# Patient Record
Sex: Female | Born: 1990
Health system: Southern US, Community
[De-identification: ages and names within clinical notes are randomized; demographics above are authoritative.]

## PROBLEM LIST (undated history)

## (undated) DIAGNOSIS — F419 Anxiety disorder, unspecified: Secondary | ICD-10-CM

## (undated) DIAGNOSIS — I10 Essential (primary) hypertension: Secondary | ICD-10-CM

## (undated) DIAGNOSIS — F32A Depression, unspecified: Secondary | ICD-10-CM

## (undated) HISTORY — DX: Anxiety disorder, unspecified: F41.9

## (undated) HISTORY — DX: Essential (primary) hypertension: I10

## (undated) HISTORY — DX: Depression, unspecified: F32.A

---
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
## 2017-01-08 LAB — CYTOLOGY - PAP: Pap: NEGATIVE

## 2019-01-06 ENCOUNTER — Encounter: Payer: Self-pay | Admitting: *Deleted

## 2019-01-06 ENCOUNTER — Ambulatory Visit (INDEPENDENT_AMBULATORY_CARE_PROVIDER_SITE_OTHER): Payer: Self-pay | Admitting: *Deleted

## 2019-01-06 ENCOUNTER — Other Ambulatory Visit: Payer: Self-pay

## 2019-01-06 DIAGNOSIS — Z8619 Personal history of other infectious and parasitic diseases: Secondary | ICD-10-CM | POA: Insufficient documentation

## 2019-01-06 DIAGNOSIS — Z3490 Encounter for supervision of normal pregnancy, unspecified, unspecified trimester: Secondary | ICD-10-CM

## 2019-01-06 NOTE — Progress Notes (Signed)
I connected with  Sally Crosby on 01/06/19 at  9:30 AM EDT by telephone and verified that I am speaking with the correct person using two identifiers.   I discussed the limitations, risks, security and privacy concerns of performing an evaluation and management service by telephone and the availability of in person appointments. I also discussed with the patient that there may be a patient responsible charge related to this service. The patient expressed understanding and agreed to proceed.  Explained I am completing her New OB Intake today. We discussed Her EDD and that it is based on  sure LMP . I reviewed her allergies, meds, OB History, Medical /Surgical history, and appropriate screenings. I explained we will send her Babyscripts app- app sent to her while on phone. She was not able to download while on phone.  She verified she has a blood pressure cuff. I asked her to bring the blood pressure cuff with her to her first ob appointment so we can show her how to use it. Explained  then we will have her take her blood pressure weekly and enter into the app. Explained she will have some visits in office and some virtually. I sent her a MyChart text and she signed up and downloaded the MyChart app.I reviewed her appointment date/ time with her , our location and to wear mask, no visitors. Explained she will have exam, ob bloodwork, hemoglobin a1C, cbg , genetic testing if desired, pap if needed.She states had pap last year- will sign release for report at new ob visit.  I  scheduled an [place] at 19 weeks and gave her the appointment. She voices understanding.  Nneka Blanda,RN 01/06/2019  9:32 AM

## 2019-01-20 ENCOUNTER — Other Ambulatory Visit: Payer: Self-pay

## 2019-01-20 ENCOUNTER — Ambulatory Visit (INDEPENDENT_AMBULATORY_CARE_PROVIDER_SITE_OTHER): Payer: Medicaid Other | Admitting: Obstetrics and Gynecology

## 2019-01-20 VITALS — BP 127/79 | HR 82 | Wt 142.7 lb

## 2019-01-20 DIAGNOSIS — Z3A16 16 weeks gestation of pregnancy: Secondary | ICD-10-CM

## 2019-01-20 DIAGNOSIS — Z3492 Encounter for supervision of normal pregnancy, unspecified, second trimester: Secondary | ICD-10-CM

## 2019-01-20 DIAGNOSIS — Z3490 Encounter for supervision of normal pregnancy, unspecified, unspecified trimester: Secondary | ICD-10-CM

## 2019-01-20 DIAGNOSIS — Z8619 Personal history of other infectious and parasitic diseases: Secondary | ICD-10-CM

## 2019-01-20 MED ORDER — BLOOD PRESSURE KIT DEVI: 1.0000 | Freq: Every day | 0 refills | Status: DC

## 2019-01-20 NOTE — Progress Notes (Signed)
Pap 2018 Normal

## 2019-01-20 NOTE — Progress Notes (Signed)
History:   Sally Crosby is a 28 y.o. G1P0 at 72w2dby LMP being seen today for her first obstetrical visit.  Her obstetrical history is significant for HSV- not on medication. Patient does intend to breast feed. Pregnancy history fully reviewed. Unplanned pregnancy, very excited.  Transfer from [employer] Patient reports no complaints.    HISTORY: OB History  Gravida Para Term Preterm AB Living  1 0 0 0 0 0  SAB TAB Ectopic Multiple Live Births  0 0 0 0 0    # Outcome Date GA Lbr Len/2nd Weight Sex Delivery Anes PTL Lv  1 Current             Last pap smear was done 2018 and was normal  No past medical history on file. Past Surgical History:  Procedure Laterality Date  . HERNIA REPAIR  1999   Family History  Problem Relation Age of Onset  . Hypertension Mother    Social History   Tobacco Use  . Smoking status: Never Smoker  . Smokeless tobacco: Never Used  Substance Use Topics  . Alcohol use: Not Currently    Comment: occasional  . Drug use: Never   No Known Allergies Current Outpatient Medications on File Prior to Visit  Medication Sig Dispense Refill  . Prenatal Vit-Fe Fumarate-FA (PRENATAL VITAMIN PO) Take 1 tablet by mouth daily.    .Marland Kitchentriamcinolone cream (KENALOG) 0.1 % APPLY TO AFFECTED AREA TWICE A DAY    . valACYclovir (VALTREX) 1000 MG tablet Take 1,000 mg by mouth daily.     No current facility-administered medications on file prior to visit.     Review of Systems Pertinent items noted in HPI and remainder of comprehensive ROS otherwise negative. Physical Exam:   Vitals:   01/20/19 1026  BP: 127/79  Pulse: 82  Weight: 142 lb 11.2 oz (64.7 kg)   Fetal Heart Rate (bpm): 150 Uterus:     System: General: well-developed, well-nourished female in no acute distress   Skin: normal coloration and turgor, no rashes   Neurologic: oriented, normal, negative, normal mood   Extremities: normal strength, tone, and muscle mass, ROM of all joints is  normal   HEENT PERRLA, extraocular movement intact and sclera clear, anicteric   Mouth/Teeth mucous membranes moist, pharynx normal without lesions and dental hygiene good   Neck supple and no masses   Cardiovascular: regular rate and rhythm   Respiratory:  no respiratory distress, normal breath sounds   Abdomen: soft, non-tender; bowel sounds normal; no masses,  no organomegaly    Assessment:    Pregnancy: G1P0 Patient Active Problem List   Diagnosis Date Noted  . Supervision of low-risk pregnancy 01/06/2019  . History of herpes genitalis 01/06/2019     Plan:    1. Encounter for supervision of low-risk pregnancy, antepartum  - Hemoglobin A1c - Obstetric Panel, Including HIV - Culture, OB Urine - Genetic Screening - Blood Pressure Monitoring (BLOOD PRESSURE KIT) DEVI; 1 Device by Does not apply route daily. ICD 10: Z34.00  Dispense: 1 Device; Refill: 0 - AFP, Serum, Open Spina Bifida Not do for pap, fully dressed today. Will do breast exam at next appt.   2. History of herpes genitalis  Suppression @ 36 weeks, discussed this    Initial labs drawn. Continue prenatal vitamins. Genetic Screening discussed, NIPS: requested. Ultrasound discussed; fetal anatomic survey: ordered. Problem list reviewed and updated. The nature of CLatonwith multiple MDs  and other Advanced Practice Providers was explained to patient; also emphasized that residents, students are part of our team. Routine obstetric precautions reviewed. Return in about 4 weeks (around 02/17/2019), or For virual visit in 4 weeks.      Marek Nghiem, Artist Pais, [place] for [employer], [place]

## 2019-01-22 LAB — OBSTETRIC PANEL, INCLUDING HIV
Antibody Screen: NEGATIVE
Basophils Absolute: 0 10*3/uL (ref 0.0–0.2)
Basos: 0 %
EOS (ABSOLUTE): 0.1 10*3/uL (ref 0.0–0.4)
Eos: 1 %
HIV Screen 4th Generation wRfx: NONREACTIVE
Hematocrit: 35.7 % (ref 34.0–46.6)
Hemoglobin: 12 g/dL (ref 11.1–15.9)
Hepatitis B Surface Ag: NEGATIVE
Immature Grans (Abs): 0.1 10*3/uL (ref 0.0–0.1)
Immature Granulocytes: 1 %
Lymphocytes Absolute: 2.4 10*3/uL (ref 0.7–3.1)
Lymphs: 25 %
MCH: 28.7 pg (ref 26.6–33.0)
MCHC: 33.6 g/dL (ref 31.5–35.7)
MCV: 85 fL (ref 79–97)
Monocytes Absolute: 0.9 10*3/uL (ref 0.1–0.9)
Monocytes: 9 %
Neutrophils Absolute: 6 10*3/uL (ref 1.4–7.0)
Neutrophils: 64 %
Platelets: 277 10*3/uL (ref 150–450)
RBC: 4.18 x10E6/uL (ref 3.77–5.28)
RDW: 13.3 % (ref 11.7–15.4)
RPR Ser Ql: NONREACTIVE
Rh Factor: POSITIVE
Rubella Antibodies, IGG: 2.71 index (ref >0.99)
WBC: 9.4 10*3/uL (ref 3.4–10.8)

## 2019-01-22 LAB — AFP, SERUM, OPEN SPINA BIFIDA
AFP MoM: 1.19
AFP Value: 45.1 ng/mL
Gest. Age on Collection Date: 16 weeks
Maternal Age At EDD: 29.4 yr
OSBR Risk 1 IN: 10000
Test Results:: NEGATIVE
Weight: 142 [lb_av]

## 2019-01-22 LAB — HEMOGLOBIN A1C
Est. average glucose Bld gHb Est-mCnc: 114 mg/dL
Hgb A1c MFr Bld: 5.6 % (ref 4.8–5.6)

## 2019-01-23 LAB — CULTURE, OB URINE

## 2019-01-23 LAB — URINE CULTURE, OB REFLEX

## 2019-02-03 ENCOUNTER — Encounter: Payer: Self-pay | Admitting: *Deleted

## 2019-02-08 ENCOUNTER — Ambulatory Visit (HOSPITAL_COMMUNITY)
Admission: RE | Admit: 2019-02-08 | Discharge: 2019-02-08 | Disposition: A | Discharge status: Home / Self Care | Payer: Medicaid Other | Source: Ambulatory Visit | Attending: Obstetrics and Gynecology | Admitting: Obstetrics and Gynecology

## 2019-02-08 ENCOUNTER — Other Ambulatory Visit (HOSPITAL_COMMUNITY): Payer: Self-pay | Admitting: *Deleted

## 2019-02-08 ENCOUNTER — Other Ambulatory Visit: Payer: Self-pay

## 2019-02-08 DIAGNOSIS — Z362 Encounter for other antenatal screening follow-up: Secondary | ICD-10-CM

## 2019-02-08 DIAGNOSIS — Z363 Encounter for antenatal screening for malformations: Secondary | ICD-10-CM

## 2019-02-08 DIAGNOSIS — Z3490 Encounter for supervision of normal pregnancy, unspecified, unspecified trimester: Secondary | ICD-10-CM | POA: Diagnosis present

## 2019-02-08 DIAGNOSIS — Z3A19 19 weeks gestation of pregnancy: Secondary | ICD-10-CM

## 2019-02-10 ENCOUNTER — Telehealth (INDEPENDENT_AMBULATORY_CARE_PROVIDER_SITE_OTHER): Payer: Medicaid Other | Admitting: Lactation Services

## 2019-02-10 DIAGNOSIS — B373 Candidiasis of vulva and vagina: Secondary | ICD-10-CM

## 2019-02-10 DIAGNOSIS — B3731 Acute candidiasis of vulva and vagina: Secondary | ICD-10-CM

## 2019-02-10 MED ORDER — TERCONAZOLE 0.4 % VA CREA: 1.0000 | TOPICAL_CREAM | Freq: Every day | VAGINAL | 0 refills | Status: DC

## 2019-02-10 NOTE — Telephone Encounter (Signed)
Pt called and reports she feels like she has a yeast infection. She reports she is dry and has a white discharge and vagina feels irritated. She has had yeast infections in the past.   Terconazole prescription sent to Pharmacy.   Informed to call the office if she does not improve post treatment. Pt voiced understanding.

## 2019-02-15 ENCOUNTER — Telehealth (INDEPENDENT_AMBULATORY_CARE_PROVIDER_SITE_OTHER): Payer: Medicaid Other | Admitting: Lactation Services

## 2019-02-15 DIAGNOSIS — Z3492 Encounter for supervision of normal pregnancy, unspecified, second trimester: Secondary | ICD-10-CM

## 2019-02-15 NOTE — Telephone Encounter (Signed)
Called pt to inform her that [facility] results were positive for Silent Carrier for Alpha Thalassemia. Informed pt it is recommended that she call [facility] at [phone] to discuss results and to determine if further testing is recommended. Pt voiced understanding.

## 2019-02-16 ENCOUNTER — Telehealth: Payer: Self-pay | Admitting: Obstetrics and Gynecology

## 2019-02-16 NOTE — Telephone Encounter (Signed)
Spoke with patient about her appointment on 11/19 @ 9:35. Patient instructed that the appointment is a mychart visit. Patient instructed to download the mychart app if not already done so. Patient verbalized she has the app downloaded. Patient instructed that a nurse will be calling her around her appointment time.

## 2019-02-17 ENCOUNTER — Telehealth (INDEPENDENT_AMBULATORY_CARE_PROVIDER_SITE_OTHER): Payer: Medicaid Other | Admitting: Obstetrics and Gynecology

## 2019-02-17 ENCOUNTER — Other Ambulatory Visit: Payer: Self-pay

## 2019-02-17 DIAGNOSIS — D219 Benign neoplasm of connective and other soft tissue, unspecified: Secondary | ICD-10-CM

## 2019-02-17 DIAGNOSIS — Z3A20 20 weeks gestation of pregnancy: Secondary | ICD-10-CM | POA: Diagnosis not present

## 2019-02-17 DIAGNOSIS — O3412 Maternal care for benign tumor of corpus uteri, second trimester: Secondary | ICD-10-CM | POA: Diagnosis not present

## 2019-02-17 DIAGNOSIS — D563 Thalassemia minor: Secondary | ICD-10-CM

## 2019-02-17 DIAGNOSIS — Z8619 Personal history of other infectious and parasitic diseases: Secondary | ICD-10-CM

## 2019-02-17 DIAGNOSIS — Z3492 Encounter for supervision of normal pregnancy, unspecified, second trimester: Secondary | ICD-10-CM

## 2019-02-17 DIAGNOSIS — D259 Leiomyoma of uterus, unspecified: Secondary | ICD-10-CM

## 2019-02-17 HISTORY — DX: Benign neoplasm of connective and other soft tissue, unspecified: D21.9

## 2019-02-17 HISTORY — DX: Thalassemia minor: D56.3

## 2019-02-17 NOTE — Progress Notes (Signed)
I connected with  Sally Crosby on 02/17/19 at  9:35 AM EST by telephone and verified that I am speaking with the correct person using two identifiers.   I discussed the limitations, risks, security and privacy concerns of performing an evaluation and management service by telephone and the availability of in person appointments. I also discussed with the patient that there may be a patient responsible charge related to this service. The patient expressed understanding and agreed to proceed.  Sally Crosby, [place] 02/17/2019  9:38 AM

## 2019-02-17 NOTE — Patient Instructions (Signed)
AREA PEDIATRIC/FAMILY PRACTICE PHYSICIANS  [place] ([address]) . [facility] o Chambliss, MD; Eniola, MD; Hale, MD; Hensel, MD; McDiarmid, MD; McIntyer, MD; Neal, MD; Walden, MD o [address] [place], [place] [address] o [phone] o Mon-Fri 8:30-12:30, 1:30-5:00 o Providers come to see babies at [facility] o Accepting Medicaid . [facility] o Limited providers who accept newborns: Koirala, MD; Morrow, MD; Wolters, MD o [address], [place], [place] [address] o [phone] o Mon-Fri 8:00-5:30 o Babies seen by providers at [facility] o Does NOT accept Medicaid o Please call early in hospitalization for appointment (limited availability)  . [facility] o Mulberry, MD o [address] [place], [place] [address] o [phone] o Mon, Tue, Thur, Fri 8:30-5:00, Wed 10:00-7:00 (closed 1-2pm) o Babies seen by [facility] providers o Accepting Medicaid . [facility] o Rubin, MD o [address], [place], [place] [address] o [phone] o Mon-Fri 8:30-5:00, Sat 8:30-12:00 o Provider comes to see babies at [facility] o Accepting Medicaid o Must have been referred from current patients or contacted office prior to delivery . [facility] ([facility]) o Brown, MD; [place], MD; Ettefagh, MD; Grant, MD; Lester, MD; McCormick, MD; McQueen, MD; Prose, MD; Simha, MD; Stanley, MD; Stryffeler, NP; Tebben, NP o [address], [place], [place] [address] o [phone] o Mon, Tue, Thur, Fri 8:30-5:30, Wed 9:30-5:30, Sat 8:30-12:30 o Babies seen by [facility] providers o Accepting Medicaid o Only accepting infants of first-time parents or siblings of current patients o Hospital discharge coordinator will make follow-up appointment . Jack Amos o [address],  [place], [place]  [address] o [phone]   Fax - [phone] . [facility] o [address], [place], [place]  [address] o Phone - [phone]   Fax - [phone] . Shilpa Gosrani o [address], [place], [place]  [address] o [phone]  [place] ([address]) . [facility] o Bates, MD; [place], MD; Cooper, Cox, MD; MD; Davis, MD; Dovico, MD; Ettefaugh, MD; Little, MD; Lowe, MD; Keiffer, MD; Melvin, MD; Sumner, MD; Williams, MD o [address], [place], [place] [address] o [phone] o Mon-Fri 8:30-5:00 (extended evenings Mon-Thur as needed), Sat-Sun 10:00-1:00 o Providers come to see babies at [facility] o Accepting Medicaid for families of first-time babies and families with all children in the household age 3 and under. Must register with office prior to making appointment (M-F only). . [facility] o Henson, NP; Knapp, MD; Lalonde, MD; Tysinger, PA o [address] [place], [place] [address] o [phone] o Mon-Fri 8:00-5:00 o Babies seen by providers at [facility] o Does NOT accept Medicaid/Commercial Insurance Only . [facility] ([facility]  o Artis, MD; Barnes, MD; Bratton, MD; Coccaro, MD; Lockett Gardner, MD; Kramer, MD; Marshall, MD; Netherton, MD; Poleto, MD; Skinner, MD o [address] [place], [place] [address] o [phone] o Mon-Fri 8:30-5:30, Sat (Oct.-Mar.) 9:00-1:00 o Babies seen by providers at [facility] o Accepting Medicaid  [place] ([address]) . [facility] o Reid, MD; Warner, MD o [address], [place], [place] [address] o [phone] o Mon-Fri 8:30-5:00, Sat 8:30-12:00 o Providers come to see babies at [facility] o Does NOT accept Medicaid . [facility] o Becker, PA; Hagler, MD; Scifres, PA; Sun, MD; Swayne, MD o [address],  [place], [place] [address] o [phone] o Mon-Fri 8:00-5:00 o Babies seen by providers at [facility] o Does NOT accept Medicaid o Only accepting babies of parents who are patients o Please call early in hospitalization for appointment (limited availability) . [facility] o Clark, MD; Frye, MD; Kelleher, MD; Mack, NP; Miller, MD; O'Keller, MD; Patterson, NP; Pudlo, MD; Puzio, MD; Thomas, MD; Tucker, MD; Twiselton, MD o [address], [place], [place] [address] o [phone] o Mon-Fri 8:00-5:00, Sat 9:00-12:00 o Providers come to see babies at [facility] o Does NOT accept Medicaid  [place] ([address]) . [facility] o Limited providers accepting new patients: Brake, NP; Wharton, PA o [address], [place], [place] [address] o [phone] o Mon-Fri 8:00-5:00 o Babies seen by providers at [facility] o Does NOT accept Medicaid o Only accepting babies of parents who are patients o Please call early in hospitalization for appointment (limited availability) . [facility] o Gay, MD; Quinlan, MD o [address] [place], [place] [address] o [phone] (press 1 to schedule appointment) o Mon-Fri 8:00-5:00 o Providers come to see babies at [facility] o Does NOT accept Medicaid . [facility] o Mazer, MD o [address] [place], [place] [address] o [phone] o Mon-Fri 8:30-5:00 (lunch 12:30-1:00), extended hours by appointment only Wed 5:00-6:30 o Babies seen by [facility] providers o Accepting Medicaid . [facility] o Banks, MD; Jordan, MD; Koberlein, MD o [address], [place], [place] [address] o [phone] o Mon-Fri 8:00-5:00 o Babies seen by [facility] providers o Does NOT accept Medicaid . [facility] o Parker, MD; Hunter, MD; Wallace, DO o [address] [place], [place]  [address] o [phone] o Mon-Fri 8:00-5:00 o Babies seen by [facility] providers o Does NOT accept Medicaid . [facility] o Brandon, PA; Brecken, PA; Christy, NP; Dees, MD; DeClaire, MD; DeWeese, MD; Hansen, NP; Mills, NP; Parrish, NP; Smoot, NP; Summer, MD; Vapne, MD o [address] [place], [place] [address] o [phone] o Mon-Fri 8:30-5:00, Sat 10:00-1:00 o Providers come to see babies at [facility] o Does NOT accept Medicaid o Free prenatal information session Tuesdays at 4:45pm . [facility] o Bouska, MD; Gordon, PA; Jeffery, PA; Weber, PA o [address] [place] [address] o [phone] o Mon-Fri 7:30-5:30 o Babies seen by [facility] providers . [facility] o [address], [place], [place]  [address] o [phone]   Fax - [phone]  [place] ([address] & [address]) . [facility] o Reese, MD o [address] [place], [place] [address] o [phone] o Mon-Thur 8:00-6:00 o Providers come to see babies at [facility] o Accepting Medicaid . [facility] o Anderson, NP; Badger, MD; Beal, PA; Spencer, PA o [address] [place], [place] [address] o [phone] o Mon-Thur 7:30-7:30, Fri 7:30-4:30 o Babies seen by [facility] providers o Accepting Medicaid . [facility] o Agbuya, MD; Klett, NP; Romgoolam, MD o [address], [place],  [address] o [phone] o Mon-Fri 8:30-5:00, Sat 8:30-12:00 o Providers come to see babies at [facility] o Accepting Medicaid o Must have "Meet & Greet" appointment at office prior to delivery . [facility]  [facility] ) o McCord,   MD; Wallace, MD; Wood, MD o [address], [place], [place] [address] o [phone] o Mon-Wed 8:00-6:00, Thur-Fri 8:00-5:00, Sat 9:00-12:00 o Providers come to  see babies at [facility] o Does NOT accept Medicaid o Only accepting siblings of current patients . [facility]  o [address], [place], [place]  [address] o [phone]   Fax - [phone] . [facility] o [address], [place], [place]  [address] o [phone]   Fax - [phone]  [place] ([address] & [address]) . [facility] o Cirigliano, DO; Matthews, DO o [address] [place], [place] [address] o [phone] o Mon-Fri 7:00-5:00 o Babies seen by [facility] providers o Does NOT accept Medicaid . [facility] o Briscoe, MD; Howley, PA; Moreira, PA o [address], [place], [place] [address] o [phone] o Mon-Fri 8:00-5:00 o Babies seen by [facility] providers o Accepting Medicaid . [facility] o Boyd, MD; Church, PA; Jones, NP; Osborn, PA o [address], , [place] [address] o [phone] o Mon-Fri 8:00-5:00 o Babies seen by providers at [facility] o Accepting Medicaid  [place] ([address]) . [facility] [place] o Wendling, DO o [address] [place], [place] [address] o [phone] o Mon-Fri 8:00-5:00 o Babies seen by [facility] providers o Does NOT accept Medicaid o Limited availability, please call early in hospitalization to schedule follow-up . [facility] o Calderon, PA; Cummings, MD; Dillard, MD; Martin, PA; Olson, MD; VanDeven, PA o [address], [place], [place] [address] o [phone] o Mon-Fri 8:30-5:00, Sat 9:00-12:00 o Babies seen by providers at [facility] o Accepting Medicaid o Please register online then schedule online or call office o [email or web address] . [facility] o Hunter, NP; Kumar, MD; Martin Rogers, PA o [address], [place], [place] [address] o [phone] o Mon-Fri 8:00-5:00 o Babies seen by providers at [facility] o Accepting Medicaid . [facility] ([facility] o Titus, MD; Kristi Fleenor, NP; West, MD o [address], [place], [place] [address] o [phone] o Mon-Fri 8:00-5:30, Sat&Sun by appointment (phones open at 8:30) o Babies seen by [facility] providers o Accepting Medicaid o Must be a first-time baby or sibling of current patient . [facility] [place]  o [address], [place], [place]  [address] o [phone]   Fax - [phone]  [place] ([address]) . [facility] o Brown, PA; Cowen, PA; Rice, MD; Helton, PA; Spry, MD o [address] [place], [place] [address] o [phone] o Mon-Thur 8:00-7:00, Fri 8:00-5:00, Sat 8:00-12:00, Sun 9:00-12:00 o Babies seen by [facility] providers o Accepting Medicaid . [facility] o Coe-Goins, MD; Marshall, MD; Pierre-Louis, MD o [address] [place] [address], [place],  [address] o [phone] o Mon-Thur 8:00-5:00 o Babies seen by providers at [facility] o Accepting Medicaid . [facility] o Bratton, MD; Coe-Goins, MD; Hayes, MD; Lewis, MD; List, MD; Lott, MD; Marshall, MD; Moran, MD; O'Neal, MD; Pierre-Louis, MD; Pitonzo, MD; Scholer, MD; Spangle, MD o [address] [place],    [address] o [phone] o Mon-Fri 8:00-5:30, Sat (Oct.-Mar.) 9:00-1:00 o Babies seen by providers at [facility] o Accepting Medicaid o Must fill out new patient packet, available online at [email or web address] . [facility] ([facility] o Friddle, NP; Harris, NP; Kelly, NP; Logan, MD;  Melvin, PA; Poth, MD; Ramadoss, MD; Stanton, NP o [address], [place], [place] [address] o [phone] o Mon-Thur 8:00-5:30, Fri 8:00-5:00 o Babies seen by providers at [facility] o Accepting Medicaid  [place] ([address]) . [facility] o Dixon, PA; [place], MD; Pickard, MD; Tapia, PA o [address], [place], [place] [address] o [phone] o Mon-Fri 8:00-5:00 o Babies seen by providers at [facility] o Accepting Medicaid   [place] ([address]) . [facility] [place] o Masneri, DO; Meyers, MD; Nelson, PA o [address], [place], [place] [address] o [phone] o Mon-Fri 8:00-5:00 o Babies seen by providers at [facility] o Does NOT accept Medicaid o Limited appointment availability, please call early in hospitalization  . [facility] [place] o Kunedd, DO; McGowen, MD o [address], [place], [place] [address] o [phone] o Mon-Fri 8:00-5:00 o Babies seen by [facility] providers o Does NOT accept Medicaid . [facility] [place] o Cameron, MD; MacDonald, MD; Michaels, PA; Nayak, MD o [address] [place] [address], [place], [place] [address] o [phone] o Mon-Fri 8:00-5:00 o After hours clinic ([address] [place], [place] [address]) [phone] Mon-Fri 5:00-8:00, Sat 12:00-6:00, Sun 10:00-4:00 o Babies seen by [facility] providers o Accepting Medicaid . [facility] [place] o [address], [place], [place]  [address] o [phone]   Fax - [phone]  [place] ([address]) . [facility] [place] o Andy, MD o [address], [place], [place] [address] o [phone] o Mon-Fri 8:00-5:00 o Babies seen by [facility] providers o Does NOT accept Medicaid . [facility] [place] ([facility] [place]) o Eksir, MD o [address], [place], [place]  [address] o [phone] o Mon-Thur 8:00-7:00, Fri 8:00-5:00, Sat 8:00-12:00 o Babies seen by providers at [facility] o Accepting Medicaid - but does not have vaccinations in office (must be received elsewhere) o Limited availability, please call early in hospitalization  [place] ([address]) . [facility]  o Charlene Flemming, MD o [address], [place] [address] o [phone]  Fax [phone]   

## 2019-02-17 NOTE — Progress Notes (Signed)
   TELEHEALTH VIRTUAL OBSTETRICS VISIT ENCOUNTER NOTE  I connected with Sally Crosby on 02/17/19 at  9:35 AM EST by telephone at home and verified that I am speaking with the correct person using two identifiers.   I discussed the limitations, risks, security and privacy concerns of performing an evaluation and management service by telephone and the availability of in person appointments. I also discussed with the patient that there may be a patient responsible charge related to this service. The patient expressed understanding and agreed to proceed.  Subjective:  Sally Crosby is a 28 y.o. G1P0 at [redacted]w[redacted]d being followed for ongoing prenatal care.  She is currently monitored for the following issues for this low-risk pregnancy and has Supervision of low-risk pregnancy; History of herpes genitalis; and Uterine fibroid in pregnancy on their problem list.  Patient reports no complaints. Reports fetal movement. Denies any contractions, bleeding or leaking of fluid.   The following portions of the patient's history were reviewed and updated as appropriate: allergies, current medications, past family history, past medical history, past social history, past surgical history and problem list.   Objective:   General:  Alert, oriented and cooperative.   Mental Status: Normal mood and affect perceived. Normal judgment and thought content.  Rest of physical exam deferred due to type of encounter  Assessment and Plan:  Pregnancy: G1P0 at [redacted]w[redacted]d 1. Encounter for supervision of low-risk pregnancy in second trimester  BP 120/84 Alphathalassemia carrier: patient was notified. I was not able to access the results of her horizon through epic. Partner is getting tested.    2. History of herpes genitalis  Valtrex starting at 36 weeks.  Preterm labor symptoms and general obstetric precautions including but not limited to vaginal bleeding, contractions, leaking of fluid and fetal movement were reviewed in  detail with the patient.  I discussed the assessment and treatment plan with the patient. The patient was provided an opportunity to ask questions and all were answered. The patient agreed with the plan and demonstrated an understanding of the instructions. The patient was advised to call back or seek an in-person office evaluation/go to [facility] at [facility] for any urgent or concerning symptoms. Please refer to After Visit Summary for other counseling recommendations.   I provided 13 minutes of non-face-to-face time during this encounter.  Return in about 4 weeks (around 03/17/2019) for Virtual visit ok .  Future Appointments  Date Time Provider [place]  03/08/2019  8:00 AM WH-MFC [place] 3 WH-MFCUS MFC-US    Karra Pink, NP Center for [employer], [place]

## 2019-02-21 ENCOUNTER — Encounter: Payer: Self-pay | Admitting: *Deleted

## 2019-03-08 ENCOUNTER — Ambulatory Visit (HOSPITAL_COMMUNITY): Payer: Medicaid Other

## 2019-03-08 ENCOUNTER — Encounter (HOSPITAL_COMMUNITY): Payer: Self-pay

## 2019-03-11 ENCOUNTER — Other Ambulatory Visit: Payer: Self-pay

## 2019-03-11 ENCOUNTER — Ambulatory Visit (HOSPITAL_COMMUNITY)
Admission: RE | Admit: 2019-03-11 | Discharge: 2019-03-11 | Disposition: A | Discharge status: Home / Self Care | Payer: Medicaid Other | Source: Ambulatory Visit | Attending: Maternal & Fetal Medicine | Admitting: Maternal & Fetal Medicine

## 2019-03-11 DIAGNOSIS — Z3A23 23 weeks gestation of pregnancy: Secondary | ICD-10-CM | POA: Diagnosis not present

## 2019-03-11 DIAGNOSIS — Z362 Encounter for other antenatal screening follow-up: Secondary | ICD-10-CM | POA: Diagnosis present

## 2019-03-29 ENCOUNTER — Telehealth: Payer: Self-pay | Admitting: *Deleted

## 2019-03-29 NOTE — Telephone Encounter (Signed)
Pt left VM message stating that she tested positive for silent carrier of Alpha Thalassemia. The called the telephone number she was given in order to schedule appointment for the FOB to be tested and was told she needed an "order number". Pt requests a call back with that information.

## 2019-04-01 NOTE — L&D Delivery Note (Signed)
OB/[facility]N [facility] Delivery Note  Sally Crosby is a 29 y.o. G1P1 s/p NSVD at [redacted]w[redacted]d. She was admitted for spontaneous labor and gestational HTN.   ROM: 7h 15m with clear fluid GBS Status: Negative/-- (03/10 0851) Maximum Maternal Temperature: 98.6 F    Labor Progress: . Patient arrived at 4 cm dilation and was augmented with AROM for clear and then progressed spontaneously to full dilation.   Delivery Date/Time: 07/09/2019 at 1617 Delivery: Called to room and patient was complete and pushing. Head delivered in LOA position. No nuchal cord present. Shoulder delivered after which patient assisted in delivering the rest of infant's body. Infant with spontaneous cry, placed on mother's abdomen, dried and stimulated. Infant's sex was a surprise and was announced by the father, "We have a daughter!" Cord clamped x 2 after 1-minute delay, and cut by FOB. Cord blood drawn. Placenta delivered spontaneously with gentle cord traction. Fundus firm with massage and Pitocin. Labia, perineum, vagina, and cervix inspected with 3a laceration repaired in the usual fashion with 2-0 and 3-0 Vicryl rapide.   Placenta: 3v intact to L&D Complications: none Lacerations: 3a laceration repaired in the usual fashion with 2-0 and 3-0 Vicryl rapide EBL: 150 cc Analgesia: epidural   Infant: APGAR (1 MIN): 9   APGAR (5 MINS): 9    Weight: 3510 grams  Augustin Coupe, MD/[facility] OB/GYN Fellow, Faculty Practice

## 2019-04-04 NOTE — Telephone Encounter (Addendum)
Spoke with Sally Crosby and was informed that the pt's order has expired and that they would need another number.  I was advised that Sally Crosby would reach out to the team to reach out to the patient for another order number.  Sally Crosby, [employer] informed me that the patient auto enrollment has expired and that was the reason for the need for the new order.  John advised me that he would reach out to the proper department, they would reach out to the patient, and he would call me back to see if they would be able to reopen previous order or if they will need to do another order.

## 2019-04-07 ENCOUNTER — Telehealth (INDEPENDENT_AMBULATORY_CARE_PROVIDER_SITE_OTHER): Payer: Medicaid Other | Admitting: Obstetrics and Gynecology

## 2019-04-07 ENCOUNTER — Other Ambulatory Visit: Payer: Self-pay

## 2019-04-07 DIAGNOSIS — Z3492 Encounter for supervision of normal pregnancy, unspecified, second trimester: Secondary | ICD-10-CM

## 2019-04-07 DIAGNOSIS — Z3A27 27 weeks gestation of pregnancy: Secondary | ICD-10-CM

## 2019-04-07 NOTE — Progress Notes (Signed)
[facility] VIRTUAL VIDEO VISIT ENCOUNTER NOTE  Provider location: [facility] [place]   I connected with Linton Flemings on 04/07/19 at  9:15 AM EST by WebEx  Encounter at home and verified that I am speaking with the correct person using two identifiers.   I discussed the limitations, risks, security and privacy concerns of performing an evaluation and management service virtually and the availability of in person appointments. I also discussed with the patient that there may be a patient responsible charge related to this service. The patient expressed understanding and agreed to proceed. Subjective:  Sarinity' Ringuette is a 29 y.o. G1P0 at [redacted]w[redacted]d being seen today for ongoing prenatal care.  She is currently monitored for the following issues for this low-risk pregnancy and has Supervision of low-risk pregnancy; History of herpes genitalis; Uterine fibroid in pregnancy; and Alpha thalassemia silent carrier on their problem list.  Patient reports no complaints.   .  .   . Denies any leaking of fluid.   The following portions of the patient's history were reviewed and updated as appropriate: allergies, current medications, past family history, past medical history, past social history, past surgical history and problem list.   Objective:  There were no vitals filed for this visit.  Fetal Status:           General:  Alert, oriented and cooperative. Patient is in no acute distress.  Respiratory: Normal respiratory effort, no problems with respiration noted  Mental Status: Normal mood and affect. Normal behavior. Normal judgment and thought content.  Rest of physical exam deferred due to type of encounter  Imaging: [place] MFM OB FOLLOW UP  Result Date: 03/11/2019 ----------------------------------------------------------------------  OBSTETRICS REPORT                       (Signed Final 03/11/2019 03:38 pm)  ---------------------------------------------------------------------- Patient Info  ID #:       [id number]                          D.O.B.:  1990-06-30 (29 yrs)  Name:       Linton Flemings                 Visit Date: 03/11/2019 03:13 pm ---------------------------------------------------------------------- Performed By  Performed By:     Valda Favia          Ref. Address:     [address] [place],                                                             [place] [address]  Attending:        Johnell Comings MD         [facility]  Referred By:      Lezlie Lye NP ---------------------------------------------------------------------- Orders   #  Description                          Code         Ordered By   1  [place] MFM OB FOLLOW UP                  [id number]     Sander Nephew  ----------------------------------------------------------------------   #  Order #                    Accession #                 Episode #   1  [id number]                  [id number]                  [id number]  ---------------------------------------------------------------------- Indications   Fetal abnormality - other known or suspected   O35.9XX0   Encounter for other antenatal screening        Z36.2   follow-up   [redacted] weeks gestation of pregnancy                Z3A.23   Negative AFP (Low Risk NIPS)  ---------------------------------------------------------------------- Vital Signs                                                 Height:        5'3" ---------------------------------------------------------------------- Fetal Evaluation  Num Of Fetuses:         1  Fetal Heart Rate(bpm):  142  Cardiac Activity:       Observed  Presentation:           Cephalic  Placenta:                Posterior  P. Cord Insertion:      Visualized, central  Amniotic Fluid  AFI FV:      Within normal limits                              Largest Pocket(cm)                              4.12 ---------------------------------------------------------------------- Biometry  BPD:      58.4  mm  G. Age:  23w 6d         63  %    CI:        77.89   %    70 - 86                                                          FL/HC:      19.1   %    19.2 - 20.8  HC:      209.4  mm     G. Age:  23w 0d         20  %    HC/AC:      1.10        1.05 - 1.21  AC:      189.6  mm     G. Age:  23w 5d         51  %    FL/BPD:     68.5   %    71 - 87  FL:         40  mm     G. Age:  22w 6d         22  %    FL/AC:      21.1   %    20 - 24  HUM:      36.6  mm     G. Age:  22w 5d         28  %  Est. FW:     587  gm      1 lb 5 oz     38  % ---------------------------------------------------------------------- OB History  Gravidity:    1 ---------------------------------------------------------------------- Gestational Age  LMP:           23w 3d        Date:  09/28/18                 EDD:   07/05/19  U/S Today:     23w 3d                                        EDD:   07/05/19  Best:          23w 3d     Det. By:  LMP  (09/28/18)          EDD:   07/05/19 ---------------------------------------------------------------------- Anatomy  Cranium:               Appears normal         Aortic Arch:            Previously seen  Cavum:                 Appears normal         Ductal Arch:            Previously seen  Ventricles:            Previously seen        Diaphragm:              Previously seen  Choroid Plexus:        Previously seen  Stomach:                Appears normal, left                                                                        sided  Cerebellum:            Previously seen        Abdomen:                Previously seen  Posterior Fossa:       Previously seen        Abdominal Wall:         Previously seen  Nuchal Fold:            Previously seen        Cord Vessels:           Previously seen  Face:                  Orbits and profile     Kidneys:                Appear normal                         previously seen  Lips:                  Appears normal         Bladder:                Appears normal  Thoracic:              Appears normal         Spine:                  Previously seen  Heart:                 Appears normal         Upper Extremities:      Previously seen                         (4CH, axis, and                         situs)  RVOT:                  Previously seen        Lower Extremities:      Previously seen  LVOT:                  Appears normal  Other:  Parents do not wish to know sex of fetus. Technically difficult due to          fetal position. ---------------------------------------------------------------------- Cervix Uterus Adnexa  Cervix  Length:            3.3  cm.  Normal appearance by transabdominal scan.  Uterus  No abnormality visualized.  Left Ovary  No adnexal mass visualized.  Right Ovary  No adnexal mass visualized.  Cul De Sac  No free fluid seen.  Adnexa  No abnormality visualized. ----------------------------------------------------------------------  Comments  This patient was seen for a follow up exam as the fetal  cardiac views were unable to be fully visualized during her  prior exam.  She denies any problems since her last exam.  She was informed that the fetal growth and amniotic fluid  level appears appropriate for her gestational age.  The views of the fetal heart were visualized today.  There  were no obvious anomalies suspected.  The limitations of  ultrasound in the detection of all anomalies was discussed.  Follow-up as indicated. ----------------------------------------------------------------------                   Johnell Comings, MD Electronically Signed Final Report   03/11/2019 03:38 pm ----------------------------------------------------------------------   Assessment and Plan:    Pregnancy: G1P0 at [redacted]w[redacted]d  1. Encounter for supervision of low-risk pregnancy in second trimester  BP: 129/80 Visit in 2 weeks for 2 hour GTT test- scheduled for Jan 20.  Would like FOB tested for alpha thalassemia- Case number given to her for testing arrangements.   Preterm labor symptoms and general obstetric precautions including but not limited to vaginal bleeding, contractions, leaking of fluid and fetal movement were reviewed in detail with the patient. I discussed the assessment and treatment plan with the patient. The patient was provided an opportunity to ask questions and all were answered. The patient agreed with the plan and demonstrated an understanding of the instructions. The patient was advised to call back or seek an in-person office evaluation/go to [facility]U at [facility] for any urgent or concerning symptoms. Please refer to After Visit Summary for other counseling recommendations.   I provided 11 minutes of face-to-face time during this encounter.  Return in about 2 weeks (around 04/21/2019) for Please schedule 2 week visit on 1/20 she is already coming for glucose. .  Future Appointments  Date Time Provider [place]  04/20/2019  8:50 AM [facility] [place], NP [facility], [place]

## 2019-04-13 ENCOUNTER — Other Ambulatory Visit: Payer: Self-pay | Admitting: Lactation Services

## 2019-04-13 DIAGNOSIS — Z3492 Encounter for supervision of normal pregnancy, unspecified, second trimester: Secondary | ICD-10-CM

## 2019-04-18 ENCOUNTER — Ambulatory Visit (INDEPENDENT_AMBULATORY_CARE_PROVIDER_SITE_OTHER): Payer: Medicaid Other

## 2019-04-18 ENCOUNTER — Other Ambulatory Visit: Payer: Self-pay

## 2019-04-18 ENCOUNTER — Telehealth: Payer: Self-pay | Admitting: *Deleted

## 2019-04-18 ENCOUNTER — Encounter: Payer: Self-pay | Admitting: *Deleted

## 2019-04-18 ENCOUNTER — Other Ambulatory Visit (HOSPITAL_COMMUNITY)
Admission: RE | Admit: 2019-04-18 | Discharge: 2019-04-18 | Disposition: A | Discharge status: Home / Self Care | Payer: Medicaid Other | Source: Ambulatory Visit | Attending: Family Medicine | Admitting: Family Medicine

## 2019-04-18 DIAGNOSIS — N898 Other specified noninflammatory disorders of vagina: Secondary | ICD-10-CM | POA: Insufficient documentation

## 2019-04-18 DIAGNOSIS — R8290 Unspecified abnormal findings in urine: Secondary | ICD-10-CM

## 2019-04-18 LAB — POCT URINALYSIS DIP (DEVICE)
Bilirubin Urine: NEGATIVE
Glucose, UA: NEGATIVE mg/dL
Ketones, ur: NEGATIVE mg/dL
Nitrite: NEGATIVE
Protein, ur: NEGATIVE mg/dL
Specific Gravity, Urine: 1.025 (ref 1.005–1.030)
Urobilinogen, UA: 0.2 mg/dL (ref 0.0–1.0)
pH: 7 (ref 5.0–8.0)

## 2019-04-18 NOTE — Telephone Encounter (Signed)
Pt concern addressed by Vaughan Basta after speaking with Johnsie Cancel they advised that another requisition is filled out with her partners information.

## 2019-04-18 NOTE — Progress Notes (Addendum)
Pt here today with c/o when she went to the bathroom she had a couple drops of blood in the toilet after she urinated.  Pt denies pain with urination.  Pt also states that she has some vaginal irritation as well.  UA resulted small leuks and trace of blood.  Pt denies any recent intercourse.  I informed pt that I will send her urine off for culture which will take at least 72 hrs before we get results.  I also explained to how to obtain self swab.  Pt advised that it will take 24-48 hrs before we get results and that we will call her with abnormal results on both the self swab and urine sample.    Mel Almond, RN 04/18/19  Chart reviewed for nurse visit. Agree with plan of care.   Virginia Rochester, NP 04/18/2019 4:12 PM

## 2019-04-18 NOTE — Telephone Encounter (Signed)
[name] left a voice message this am she went to restroom Friday night and saw blood in her urine. States she wants an appointment to get it checked.  I called [name] and I informed her we had replied to her MyChart message re: same issue but since she called also I called her back to discuss. She denies any pain or burning with urination; states she thinks she saw a little blood Saturday but none since then; only urine darker. We discussed urine being darker could also be dehydration. She would like to get urine checked before Wednesday appointment. I offerered her nurse visit for [place] today which she accepted.  Sally Crosby

## 2019-04-19 LAB — CERVICOVAGINAL ANCILLARY ONLY
Bacterial Vaginitis (gardnerella): NEGATIVE
Candida Glabrata: NEGATIVE
Candida Vaginitis: POSITIVE — AB
Chlamydia: NEGATIVE
Comment: NEGATIVE
Comment: NEGATIVE
Comment: NEGATIVE
Comment: NEGATIVE
Comment: NEGATIVE
Comment: NORMAL
Neisseria Gonorrhea: NEGATIVE
Trichomonas: NEGATIVE

## 2019-04-20 ENCOUNTER — Ambulatory Visit (INDEPENDENT_AMBULATORY_CARE_PROVIDER_SITE_OTHER): Payer: Medicaid Other | Admitting: Medical

## 2019-04-20 ENCOUNTER — Other Ambulatory Visit: Payer: Self-pay | Admitting: Nurse Practitioner

## 2019-04-20 ENCOUNTER — Other Ambulatory Visit: Payer: Self-pay

## 2019-04-20 ENCOUNTER — Other Ambulatory Visit: Payer: Medicaid Other

## 2019-04-20 ENCOUNTER — Encounter: Payer: Self-pay | Admitting: Medical

## 2019-04-20 VITALS — BP 134/74 | HR 88 | Wt 158.0 lb

## 2019-04-20 DIAGNOSIS — Z8619 Personal history of other infectious and parasitic diseases: Secondary | ICD-10-CM

## 2019-04-20 DIAGNOSIS — Z3493 Encounter for supervision of normal pregnancy, unspecified, third trimester: Secondary | ICD-10-CM

## 2019-04-20 DIAGNOSIS — O26893 Other specified pregnancy related conditions, third trimester: Secondary | ICD-10-CM

## 2019-04-20 DIAGNOSIS — Z3A29 29 weeks gestation of pregnancy: Secondary | ICD-10-CM

## 2019-04-20 DIAGNOSIS — Z3492 Encounter for supervision of normal pregnancy, unspecified, second trimester: Secondary | ICD-10-CM

## 2019-04-20 DIAGNOSIS — D563 Thalassemia minor: Secondary | ICD-10-CM

## 2019-04-20 LAB — CULTURE, OB URINE

## 2019-04-20 LAB — URINE CULTURE, OB REFLEX

## 2019-04-20 MED ORDER — TERCONAZOLE 0.4 % VA CREA: 1.0000 | TOPICAL_CREAM | Freq: Every day | VAGINAL | 0 refills | Status: DC

## 2019-04-20 NOTE — Patient Instructions (Signed)
Braxton Hicks Contractions Contractions of the uterus can occur throughout pregnancy, but they are not always a sign that you are in labor. You may have practice contractions called Braxton Hicks contractions. These false labor contractions are sometimes confused with true labor. What are Braxton Hicks contractions? Braxton Hicks contractions are tightening movements that occur in the muscles of the uterus before labor. Unlike true labor contractions, these contractions do not result in opening (dilation) and thinning of the cervix. Toward the end of pregnancy (32-34 weeks), Braxton Hicks contractions can happen more often and may become stronger. These contractions are sometimes difficult to tell apart from true labor because they can be very uncomfortable. You should not feel embarrassed if you go to the hospital with false labor. Sometimes, the only way to tell if you are in true labor is for your health care provider to look for changes in the cervix. The health care provider will do a physical exam and may monitor your contractions. If you are not in true labor, the exam should show that your cervix is not dilating and your water has not broken. If there are no other health problems associated with your pregnancy, it is completely safe for you to be sent home with false labor. You may continue to have Braxton Hicks contractions until you go into true labor. How to tell the difference between true labor and false labor True labor  Contractions last 30-70 seconds.  Contractions become very regular.  Discomfort is usually felt in the top of the uterus, and it spreads to the lower abdomen and low back.  Contractions do not go away with walking.  Contractions usually become more intense and increase in frequency.  The cervix dilates and gets thinner. False labor  Contractions are usually shorter and not as strong as true labor contractions.  Contractions are usually irregular.  Contractions  are often felt in the front of the lower abdomen and in the groin.  Contractions may go away when you walk around or change positions while lying down.  Contractions get weaker and are shorter-lasting as time goes on.  The cervix usually does not dilate or become thin. Follow these instructions at home:   Take over-the-counter and prescription medicines only as told by your health care provider.  Keep up with your usual exercises and follow other instructions from your health care provider.  Eat and drink lightly if you think you are going into labor.  If Braxton Hicks contractions are making you uncomfortable: ? Change your position from lying down or resting to walking, or change from walking to resting. ? Sit and rest in a tub of warm water. ? Drink enough fluid to keep your urine pale yellow. Dehydration may cause these contractions. ? Do slow and deep breathing several times an hour.  Keep all follow-up prenatal visits as told by your health care provider. This is important. Contact a health care provider if:  You have a fever.  You have continuous pain in your abdomen. Get help right away if:  Your contractions become stronger, more regular, and closer together.  You have fluid leaking or gushing from your vagina.  You pass blood-tinged mucus (bloody show).  You have bleeding from your vagina.  You have low back pain that you never had before.  You feel your baby's head pushing down and causing pelvic pressure.  Your baby is not moving inside you as much as it used to. Summary  Contractions that occur before labor are   called Braxton Hicks contractions, false labor, or practice contractions.  Braxton Hicks contractions are usually shorter, weaker, farther apart, and less regular than true labor contractions. True labor contractions usually become progressively stronger and regular, and they become more frequent.  Manage discomfort from Braxton Hicks contractions  by changing position, resting in a warm bath, drinking plenty of water, or practicing deep breathing. This information is not intended to replace advice given to you by your health care provider. Make sure you discuss any questions you have with your health care provider. Document Revised: 02/27/2017 Document Reviewed: 07/31/2016 Elsevier Patient Education  2020 Elsevier Inc[employer] Fetal Movement Counts Patient Name: ________________________________________________ Patient Due Date: ____________________ What is a fetal movement count?  A fetal movement count is the number of times that you feel your baby move during a certain amount of time. This may also be called a fetal kick count. A fetal movement count is recommended for every pregnant woman. You may be asked to start counting fetal movements as early as week 28 of your pregnancy. Pay attention to when your baby is most active. You may notice your baby's sleep and wake cycles. You may also notice things that make your baby move more. You should do a fetal movement count:  When your baby is normally most active.  At the same time each day. A good time to count movements is while you are resting, after having something to eat and drink. How do I count fetal movements? 1. Find a quiet, comfortable area. Sit, or lie down on your side. 2. Write down the date, the start time and stop time, and the number of movements that you felt between those two times. Take this information with you to your health care visits. 3. Write down your start time when you feel the first movement. 4. Count kicks, flutters, swishes, rolls, and jabs. You should feel at least 10 movements. 5. You may stop counting after you have felt 10 movements, or if you have been counting for 2 hours. Write down the stop time. 6. If you do not feel 10 movements in 2 hours, contact your health care provider for further instructions. Your health care provider may want to do additional tests  to assess your baby's well-being. Contact a health care provider if:  You feel fewer than 10 movements in 2 hours.  Your baby is not moving like he or she usually does. Date: ____________ Start time: ____________ Stop time: ____________ Movements: ____________ Date: ____________ Start time: ____________ Stop time: ____________ Movements: ____________ Date: ____________ Start time: ____________ Stop time: ____________ Movements: ____________ Date: ____________ Start time: ____________ Stop time: ____________ Movements: ____________ Date: ____________ Start time: ____________ Stop time: ____________ Movements: ____________ Date: ____________ Start time: ____________ Stop time: ____________ Movements: ____________ Date: ____________ Start time: ____________ Stop time: ____________ Movements: ____________ Date: ____________ Start time: ____________ Stop time: ____________ Movements: ____________ Date: ____________ Start time: ____________ Stop time: ____________ Movements: ____________ This information is not intended to replace advice given to you by your health care provider. Make sure you discuss any questions you have with your health care provider. Document Revised: 11/04/2018 Document Reviewed: 11/04/2018 Elsevier Patient Education  2020 Elsevier Inc.  

## 2019-04-20 NOTE — Progress Notes (Signed)
   PRENATAL VISIT NOTE  Subjective:  Sally Crosby is a 29 y.o. G1P0 at [redacted]w[redacted]d being seen today for ongoing prenatal care.  She is currently monitored for the following issues for this low-risk pregnancy and has Supervision of low-risk pregnancy; History of herpes genitalis; Uterine fibroid in pregnancy; and Alpha thalassemia silent carrier on their problem list.  Patient reports no complaints.  Contractions: Not present. Vag. Bleeding: None.  Movement: Present. Denies leaking of fluid.   The following portions of the patient's history were reviewed and updated as appropriate: allergies, current medications, past family history, past medical history, past social history, past surgical history and problem list.   Objective:   Vitals:   04/20/19 0941  BP: 134/74  Pulse: 88  Weight: 158 lb (71.7 kg)    Fetal Status: Fetal Heart Rate (bpm): 166 Fundal Height: 29 cm Movement: Present     General:  Alert, oriented and cooperative. Patient is in no acute distress.  Skin: Skin is warm and dry. No rash noted.   Cardiovascular: Normal heart rate noted  Respiratory: Normal respiratory effort, no problems with respiration noted  Abdomen: Soft, gravid, appropriate for gestational age.  Pain/Pressure: Absent     Pelvic: Cervical exam deferred        Extremities: Normal range of motion.  Edema: None  Mental Status: Normal mood and affect. Normal behavior. Normal judgment and thought content.   Assessment and Plan:  Pregnancy: G1P0 at [redacted]w[redacted]d 1. Encounter for supervision of low-risk pregnancy in third trimester - 2 hour GTT, CBC, HIV and RPR today  - Has already had TDap   2. History of herpes genitalis - Needs suppression at 36 weeks   3. Alpha thalassemia silent carrier - Trying to get partner tested  Preterm labor symptoms and general obstetric precautions including but not limited to vaginal bleeding, contractions, leaking of fluid and fetal movement were reviewed in detail with the  patient. Please refer to After Visit Summary for other counseling recommendations.   Return in about 2 weeks (around 05/04/2019) for LOB, Virtual.  No future appointments.  Kerry Hough, PA-C

## 2019-04-21 LAB — CBC
Hematocrit: 33.2 % — ABNORMAL LOW (ref 34.0–46.6)
Hemoglobin: 11.1 g/dL (ref 11.1–15.9)
MCH: 28.4 pg (ref 26.6–33.0)
MCHC: 33.4 g/dL (ref 31.5–35.7)
MCV: 85 fL (ref 79–97)
Platelets: 219 10*3/uL (ref 150–450)
RBC: 3.91 x10E6/uL (ref 3.77–5.28)
RDW: 13.2 % (ref 11.7–15.4)
WBC: 10.3 10*3/uL (ref 3.4–10.8)

## 2019-04-21 LAB — GLUCOSE TOLERANCE, 2 HOURS W/ 1HR
Glucose, 1 hour: 122 mg/dL (ref 65–179)
Glucose, 2 hour: 133 mg/dL (ref 65–152)
Glucose, Fasting: 77 mg/dL (ref 65–91)

## 2019-04-21 LAB — RPR: RPR Ser Ql: NONREACTIVE

## 2019-04-21 LAB — HIV ANTIBODY (ROUTINE TESTING W REFLEX): HIV Screen 4th Generation wRfx: NONREACTIVE

## 2019-04-30 ENCOUNTER — Other Ambulatory Visit: Payer: Self-pay

## 2019-04-30 ENCOUNTER — Inpatient Hospital Stay (HOSPITAL_COMMUNITY)
Admission: AD | Admit: 2019-04-30 | Discharge: 2019-05-01 | Disposition: A | Discharge status: Home / Self Care | Payer: Medicaid Other | Source: Ambulatory Visit | Attending: Obstetrics & Gynecology | Admitting: Obstetrics & Gynecology

## 2019-04-30 DIAGNOSIS — O4693 Antepartum hemorrhage, unspecified, third trimester: Secondary | ICD-10-CM | POA: Insufficient documentation

## 2019-04-30 DIAGNOSIS — Z3689 Encounter for other specified antenatal screening: Secondary | ICD-10-CM

## 2019-04-30 DIAGNOSIS — R109 Unspecified abdominal pain: Secondary | ICD-10-CM | POA: Insufficient documentation

## 2019-04-30 DIAGNOSIS — O4690 Antepartum hemorrhage, unspecified, unspecified trimester: Secondary | ICD-10-CM

## 2019-04-30 DIAGNOSIS — Z3A30 30 weeks gestation of pregnancy: Secondary | ICD-10-CM

## 2019-05-01 ENCOUNTER — Encounter (HOSPITAL_COMMUNITY): Payer: Self-pay | Admitting: Obstetrics & Gynecology

## 2019-05-01 DIAGNOSIS — O4693 Antepartum hemorrhage, unspecified, third trimester: Secondary | ICD-10-CM | POA: Diagnosis not present

## 2019-05-01 DIAGNOSIS — R109 Unspecified abdominal pain: Secondary | ICD-10-CM | POA: Diagnosis present

## 2019-05-01 DIAGNOSIS — Z3A30 30 weeks gestation of pregnancy: Secondary | ICD-10-CM | POA: Diagnosis not present

## 2019-05-01 LAB — WET PREP, GENITAL
Clue Cells Wet Prep HPF POC: NONE SEEN
Sperm: NONE SEEN
Trich, Wet Prep: NONE SEEN
Yeast Wet Prep HPF POC: NONE SEEN

## 2019-05-01 LAB — URINALYSIS, ROUTINE W REFLEX MICROSCOPIC
Bilirubin Urine: NEGATIVE
Glucose, UA: 50 mg/dL — AB
Ketones, ur: NEGATIVE mg/dL
Leukocytes,Ua: NEGATIVE
Nitrite: NEGATIVE
Protein, ur: NEGATIVE mg/dL
RBC / HPF: >50 RBC/hpf — ABNORMAL HIGH (ref 0–5)
Specific Gravity, Urine: 1.008 (ref 1.005–1.030)
pH: 6 (ref 5.0–8.0)

## 2019-05-01 NOTE — MAU Provider Note (Signed)
History     CSN: [id number]  Arrival date and time: 04/30/19 2339   First Provider Initiated Contact with Patient 05/01/19 [phone]      Chief Complaint  Patient presents with  . Abdominal Pain  . Vaginal Bleeding   Sally Crosby is a 29 y.o. G1P0 at 73w5dwho [facility]  She presents today for Abdominal Pain and Vaginal Bleeding.  She states she used the restroom around 9pm and saw blood when she wiped.  She states it was "pinkish red" and did not contain any clots or blood in the toilet.  She did not notice any blood upon arrival to the MAU.  She endorses fetal movement and denies abdominal cramping or contractions, but goes on to say "I did feel like cramping." She states she experienced cramping in the lobby, but "nothing painful, it was very mildly" and patient states she is not experiencing pain now.  She denies sexual activity in the past 3 days and reports "light or clear" vaginal discharge.  Patient reports she had a yeast infection and completed the medication last week. 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      OB History    Gravida  1   Para      Term      Preterm      AB      Living        SAB      TAB      Ectopic      Multiple      Live Births              History reviewed. No pertinent past medical history.  Past Surgical History:  Procedure Laterality Date  . HERNIA REPAIR  1999    Family History  Problem Relation Age of Onset  . Hypertension Mother     Social History   Tobacco Use  . Smoking status: Never Smoker  . Smokeless tobacco: Never Used  Substance Use Topics  . Alcohol use: Not Currently    Comment: occasional  . Drug use: Never    Allergies: No Known Allergies  Medications Prior to Admission  Medication Sig Dispense Refill Last Dose  . Prenatal Vit-Fe Fumarate-FA (PRENATAL VITAMIN PO) Take 1 tablet by mouth daily.   Past Week at Unknown time  .  Blood Pressure Monitoring (BLOOD PRESSURE KIT) DEVI 1 Device by Does not apply route daily. ICD 10: Z34.00 1 Device 0 Unknown at Unknown time  . terconazole (TERAZOL 7) 0.4 % vaginal cream Place 1 applicator vaginally at bedtime. Use for 7 days. (Patient not taking: Reported on 04/20/2019) 45 g 0   . valACYclovir (VALTREX) 1000 MG tablet Take 1,000 mg by mouth daily.       Review of Systems  Constitutional: Negative for chills and fever.  Respiratory: Negative for cough and shortness of breath.   Gastrointestinal: Negative for abdominal pain, constipation, diarrhea, nausea and vomiting.  Genitourinary: Positive for vaginal bleeding and vaginal discharge. Negative for difficulty urinating, dysuria and pelvic pain.  Musculoskeletal: Negative for back pain.  Neurological: Negative for dizziness, light-headedness and headaches.   Physical Exam   Blood pressure 136/77, pulse (!) 107, temperature 98.6 F (37 C), temperature source Oral, resp. rate 18, last menstrual period 09/28/2018.  Physical Exam  Constitutional: She is oriented to person, place, and time. She appears well-developed and well-nourished. No distress.  HENT:  Head:  Normocephalic and atraumatic.  Eyes: Conjunctivae are normal.  Cardiovascular: Normal rate, regular rhythm and normal heart sounds.  Respiratory: Effort normal and breath sounds normal.  GI: Soft. There is no abdominal tenderness.  Gravid--fundal height appears AGA, Soft, NT   Genitourinary: Cervix exhibits no motion tenderness, no discharge and no friability.    No vaginal discharge or bleeding.  No bleeding in the vagina.    Genitourinary Comments: Speculum Exam: -Normal External Genitalia: Non tender, no apparent discharge at introitus.  -Vaginal Vault: Pink mucosa with good rugae. Scant discharge -wet prep collected -Cervix:Pink, no lesions or polyps. Nabothian cyst noted at [facility] clock.  Appears closed. No active bleeding from os -Bimanual Exam:  Closed     Musculoskeletal:        General: Normal range of motion.     Cervical back: Normal range of motion.  Neurological: She is alert and oriented to person, place, and time.  Skin: Skin is warm and dry.  Psychiatric: She has a normal mood and affect. Her behavior is normal.    Fetal Assessment 145 bpm, Mod Var, -Decels, +Accels Toco: None graphed  MAU Course   Results for orders placed or performed during the hospital encounter of 04/30/19 (from the past 24 hour(s))  Wet prep, genital     Status: Abnormal   Collection Time: 05/01/19 12:47 AM   Specimen: Vaginal  Result Value Ref Range   Yeast Wet Prep HPF POC NONE SEEN NONE SEEN   Trich, Wet Prep NONE SEEN NONE SEEN   Clue Cells Wet Prep HPF POC NONE SEEN NONE SEEN   WBC, Wet Prep HPF POC MANY (A) NONE SEEN   Sperm NONE SEEN   Urinalysis, Routine w reflex microscopic     Status: Abnormal   Collection Time: 05/01/19 12:51 AM  Result Value Ref Range   Color, Urine YELLOW YELLOW   APPearance CLEAR CLEAR   Specific Gravity, Urine 1.008 1.005 - 1.030   pH 6.0 5.0 - 8.0   Glucose, UA 50 (A) NEGATIVE mg/dL   Hgb urine dipstick LARGE (A) NEGATIVE   Bilirubin Urine NEGATIVE NEGATIVE   Ketones, ur NEGATIVE NEGATIVE mg/dL   Protein, ur NEGATIVE NEGATIVE mg/dL   Nitrite NEGATIVE NEGATIVE   Leukocytes,Ua NEGATIVE NEGATIVE   RBC / HPF >50 (H) 0 - 5 RBC/hpf   WBC, UA 0-5 0 - 5 WBC/hpf   Bacteria, UA RARE (A) NONE SEEN   Squamous Epithelial / LPF 0-5 0 - 5   No results found.  MDM PE Labs: Wet Prep, UA EFM  Assessment and Plan  29 year old G1P0  SIUP at 30.5 weeks Cat I FT Vaginal Bleeding  -POC reviewed -Exam performed and findings discussed. -Cultures collected and pending.  -Discussed normalcy of self-limited spotting in pregnancy. -Patient questions if blood could of came from urine. -Discussed need for proper hydration throughout the day.  Educated on proper intake of 64+ ounces. -UA sent. -Will await  results.  Maryann Conners MSN, CNM 05/01/2019, 12:27 AM   Reassessment (1:15 AM)  -Wet prep returns negative. -UA with large blood. -Nurse instructed to inform patient of results with precautions for pain onset and to encourage to increase fluid intake. -Encouraged to call or return to MAU if symptoms worsen or with the onset of new symptoms. -Discharged to home in stable condition.  Maryann Conners MSN, CNM Advanced Practice Provider, [facility] for [facility]

## 2019-05-01 NOTE — MAU Note (Signed)
Patient reports to MAU c/o vaginal spotting that started around 2045. Pt only reports it is when she wipes. Pt reports mild abdominal cramping. +FM.

## 2019-05-01 NOTE — Discharge Instructions (Signed)
Vaginal Bleeding During Pregnancy, Third Trimester  A small amount of bleeding from the vagina (spotting) is relatively common during pregnancy. Various things can cause bleeding or spotting during pregnancy. Sometimes bleeding is normal and is not a problem. However, bleeding during the third trimester can also be a sign of something serious for the mother and the baby. Be sure to tell your health care provider about any vaginal bleeding right away. Some possible causes of vaginal bleeding during the third trimester include:  Infection or growths (polyps) on the cervix.  A condition in which the placenta partially or completely covers the opening of the cervix inside the uterus (placenta previa).  The placenta separating from the uterus (placenta abruption).  The start of labor (discharging of the mucus plug).  A condition in which the placenta grows into the muscle layer of the uterus (placenta accreta). Follow these instructions at home: Activity  Follow instructions from your health care provider about limiting your activity. If your health care provider recommends activity restriction, you may need to stay in bed and only get up to use the bathroom. In some cases, your health care provider may allow you to continue light activity.  If needed, make plans for someone to help with your regular activities.  Ask your health care provider if it is safe for you to drive.  Do not lift anything that is heavier than 10 lb (4.5 kg), or the limit that your health care provider tells you, until he or she says that it is safe.  Do not have sex or orgasms until your health care provider says that this is safe. Medicines  Take over-the-counter and prescription medicines only as told by your health care provider.  Do not take aspirin because it can cause bleeding. General instructions  Pay attention to any changes in your symptoms.  Write down how many pads you use each day, how often you  change pads, and how soaked (saturated) they are.  Do not use tampons or douche.  If you pass any tissue from your vagina, save the tissue so you can show it to your health care provider.  Keep all follow-up visits as told by your health care provider. This is important. Contact a health care provider if:  You have vaginal bleeding during any part of your pregnancy.  You have cramps or labor pains.  You have a fever. Get help right away if:  You have severe cramps or pain in your back or abdomen.  You have a gush of fluid from the vagina.  You pass large clots or a large amount of tissue from your vagina.  Your bleeding increases.  You feel light-headed or weak.  You faint.  You feel that your baby is moving less than usual, or not moving at all. Summary  Various things can cause bleeding or spotting in pregnancy.  Bleeding during the third trimester can be a sign of a serious problem for the mother and the baby.  Be sure to tell your health care provider about any vaginal bleeding right away. This information is not intended to replace advice given to you by your health care provider. Make sure you discuss any questions you have with your health care provider. Document Revised: 07/06/2018 Document Reviewed: 06/19/2016 Elsevier Patient Education  2020 Elsevier Inc.  

## 2019-05-05 ENCOUNTER — Encounter: Payer: Self-pay | Admitting: Obstetrics and Gynecology

## 2019-05-05 ENCOUNTER — Telehealth (INDEPENDENT_AMBULATORY_CARE_PROVIDER_SITE_OTHER): Payer: Medicaid Other | Admitting: Obstetrics and Gynecology

## 2019-05-05 DIAGNOSIS — D259 Leiomyoma of uterus, unspecified: Secondary | ICD-10-CM

## 2019-05-05 DIAGNOSIS — O3413 Maternal care for benign tumor of corpus uteri, third trimester: Secondary | ICD-10-CM

## 2019-05-05 DIAGNOSIS — Z8619 Personal history of other infectious and parasitic diseases: Secondary | ICD-10-CM

## 2019-05-05 DIAGNOSIS — O3410 Maternal care for benign tumor of corpus uteri, unspecified trimester: Secondary | ICD-10-CM

## 2019-05-05 DIAGNOSIS — Z3A31 31 weeks gestation of pregnancy: Secondary | ICD-10-CM

## 2019-05-05 DIAGNOSIS — Z3493 Encounter for supervision of normal pregnancy, unspecified, third trimester: Secondary | ICD-10-CM

## 2019-05-05 NOTE — Progress Notes (Signed)
I connected with  Linton Flemings on 05/05/19 at 1608 by MyChart and verified that I am speaking with the correct person using two identifiers.   I discussed the limitations, risks, security and privacy concerns of performing an evaluation and management service by telephone and the availability of in person appointments. I also discussed with the patient that there may be a patient responsible charge related to this service. The patient expressed understanding and agreed to proceed.  Annabell Howells, RN 05/05/2019  4:08 PM

## 2019-05-05 NOTE — Progress Notes (Signed)
   [facility] VIRTUAL VIDEO VISIT ENCOUNTER NOTE  Provider location: [facility] [place]   I connected with Linton Flemings on 05/05/19 at  4:15 PM EST by MyChart Video Encounter at home and verified that I am speaking with the correct person using two identifiers.   I discussed the limitations, risks, security and privacy concerns of performing an evaluation and management service virtually and the availability of in person appointments. I also discussed with the patient that there may be a patient responsible charge related to this service. The patient expressed understanding and agreed to proceed. Subjective:  Sally Crosby is a 29 y.o. G1P0 at [redacted]w[redacted]d being seen today for ongoing prenatal care.  She is currently monitored for the following issues for this low-risk pregnancy and has Supervision of low-risk pregnancy; History of herpes genitalis; Uterine fibroid in pregnancy; and Alpha thalassemia silent carrier on their problem list.  Patient reports had a small amount of bleeding, went to MAU and no bleeding noted.  Contractions: Irritability. Vag. Bleeding: None.  Movement: Present. Denies any leaking of fluid.   The following portions of the patient's history were reviewed and updated as appropriate: allergies, current medications, past family history, past medical history, past social history, past surgical history and problem list.   Objective:   Vitals:   05/05/19 1609 05/05/19 1623  BP: 138/79 122/77  Pulse: 97 95    Fetal Status:     Movement: Present     General:  Alert, oriented and cooperative. Patient is in no acute distress.  Respiratory: Normal respiratory effort, no problems with respiration noted  Mental Status: Normal mood and affect. Normal behavior. Normal judgment and thought content.  Rest of physical exam deferred due to type of encounter  Imaging: No results found.  Assessment and Plan:  Pregnancy: G1P0 at [redacted]w[redacted]d  1.  Uterine fibroid in pregnancy  2. Encounter for supervision of low-risk pregnancy in third trimester Reviewed hospital policies regarding visitors, masking, COVID testing, etc.  3. History of herpes genitalis ppx at 36 weeks  Preterm labor symptoms and general obstetric precautions including but not limited to vaginal bleeding, contractions, leaking of fluid and fetal movement were reviewed in detail with the patient. I discussed the assessment and treatment plan with the patient. The patient was provided an opportunity to ask questions and all were answered. The patient agreed with the plan and demonstrated an understanding of the instructions. The patient was advised to call back or seek an in-person office evaluation/go to MAU at [facility] for any urgent or concerning symptoms. Please refer to After Visit Summary for other counseling recommendations.   I provided 15 minutes of face-to-face time during this encounter.  Return in about 2 weeks (around 05/19/2019) for low OB.  No future appointments.  Sloan Leiter, MD [facility] [place], [place]

## 2019-05-06 ENCOUNTER — Encounter: Payer: Self-pay | Admitting: *Deleted

## 2019-05-24 ENCOUNTER — Telehealth: Payer: Medicaid Other | Admitting: Student

## 2019-05-24 NOTE — Progress Notes (Signed)
Subjective:  Sally Crosby is a 29 y.o. G1P0 at [redacted]w[redacted]d being seen today for ongoing prenatal care.  She is currently monitored for the following issues for this low-risk pregnancy and has Supervision of low-risk pregnancy; History of herpes genitalis; Uterine fibroid in pregnancy; and Alpha thalassemia silent carrier on their problem list.  Patient reports no complaints.  Contractions: Irritability. Vag. Bleeding: None.  Movement: Present. Denies leaking of fluid.   The following portions of the patient's history were reviewed and updated as appropriate: allergies, current medications, past family history, past medical history, past social history, past surgical history and problem list. Problem list updated.  Objective:   Vitals:   05/25/19 0825  BP: 118/66  Pulse: 86  Weight: 165 lb 1.6 oz (74.9 kg)    Fetal Status: Fetal Heart Rate (bpm): 158 Fundal Height: 32 cm Movement: Present     General:  Alert, oriented and cooperative. Patient is in no acute distress.  Skin: Skin is warm and dry. No rash noted.   Cardiovascular: Normal heart rate noted  Respiratory: Normal respiratory effort, no problems with respiration noted  Abdomen: Soft, gravid, appropriate for gestational age. Pain/Pressure: Present     Pelvic: Vag. Bleeding: None     Cervical exam deferred        Extremities: Normal range of motion.  Edema: None  Mental Status: Normal mood and affect. Normal behavior. Normal judgment and thought content.    Assessment and Plan:  Pregnancy: G1P0 at [redacted]w[redacted]d  1. Encounter for supervision of low-risk pregnancy in third trimester -discussed contraception, pt unsure, information given -GBS next visit, pt aware, information given -pt given note for school/work to include EDD and time period for postpartum recovery (12 weeks) -pt also requests note saying that she is a patient with [facility] for her 4D [place] appointment  2. History of herpes genitalis - pt aware to start at 35/36 weeks -  valACYclovir (VALTREX) 500 MG tablet; Take 1 tablet (500 mg total) by mouth 2 (two) times daily.  Dispense: 60 tablet; Refill: 1  3. Uterine fibroid in pregnancy  4. Alpha thalassemia silent carrier -pt reports partner was tested and is negative -pt declines genetic counseling  Preterm labor symptoms and general obstetric precautions including but not limited to vaginal bleeding, contractions, leaking of fluid and fetal movement were reviewed in detail with the patient. I discussed the assessment and treatment plan with the patient. The patient was provided an opportunity to ask questions and all were answered. The patient agreed with the plan and demonstrated an understanding of the instructions. The patient was advised to call back or seek an in-person office evaluation/go to [facility]U at [facility] for any urgent or concerning symptoms. Please refer to After Visit Summary for other counseling recommendations.  Return in about 2 weeks (around 06/08/2019) for in-person LOB/GBS.   Laloni Rowton, Gerrie Nordmann, NP

## 2019-05-25 ENCOUNTER — Ambulatory Visit (INDEPENDENT_AMBULATORY_CARE_PROVIDER_SITE_OTHER): Payer: Medicaid Other | Admitting: Women's Health

## 2019-05-25 ENCOUNTER — Other Ambulatory Visit: Payer: Self-pay

## 2019-05-25 VITALS — BP 118/66 | HR 86 | Wt 165.1 lb

## 2019-05-25 DIAGNOSIS — O3410 Maternal care for benign tumor of corpus uteri, unspecified trimester: Secondary | ICD-10-CM

## 2019-05-25 DIAGNOSIS — D563 Thalassemia minor: Secondary | ICD-10-CM

## 2019-05-25 DIAGNOSIS — Z3A34 34 weeks gestation of pregnancy: Secondary | ICD-10-CM

## 2019-05-25 DIAGNOSIS — D259 Leiomyoma of uterus, unspecified: Secondary | ICD-10-CM

## 2019-05-25 DIAGNOSIS — Z3493 Encounter for supervision of normal pregnancy, unspecified, third trimester: Secondary | ICD-10-CM

## 2019-05-25 DIAGNOSIS — Z8619 Personal history of other infectious and parasitic diseases: Secondary | ICD-10-CM

## 2019-05-25 MED ORDER — VALACYCLOVIR HCL 500 MG PO TABS: 500.0000 mg | ORAL_TABLET | Freq: Two times a day (BID) | ORAL | 1 refills | Status: DC

## 2019-05-25 NOTE — Patient Instructions (Addendum)
Maternity Assessment Unit (MAU)  The Maternity Assessment Unit (MAU) is located at the [facility]. The address is: [address] [place], [place], [place] [address]. Please see map below for additional directions.    The Maternity Assessment Unit is designed to help you during your pregnancy, and for up to 6 weeks after delivery, with any pregnancy- or postpartum-related emergencies, if you think you are in labor, or if your water has broken. For example, if you experience nausea and vomiting, vaginal bleeding, severe abdominal or pelvic pain, elevated blood pressure or other problems related to your pregnancy or postpartum time, please come to the Maternity Assessment Unit for assistance.  Preterm Labor and Birth Information  The normal length of a pregnancy is 39-41 weeks. Preterm labor is when labor starts before 37 completed weeks of pregnancy. What are the risk factors for preterm labor? Preterm labor is more likely to occur in women who:  Have certain infections during pregnancy such as a bladder infection, sexually transmitted infection, or infection inside the uterus (chorioamnionitis).  Have a shorter-than-normal cervix.  Have gone into preterm labor before.  Have had surgery on their cervix.  Are younger than age 74 or older than age 9.  Are African American.  Are pregnant with twins or multiple babies (multiple gestation).  Take street drugs or smoke while pregnant.  Do not gain enough weight while pregnant.  Became pregnant shortly after having been pregnant. What are the symptoms of preterm labor? Symptoms of preterm labor include:  Cramps similar to those that can happen during a menstrual period. The cramps may happen with diarrhea.  Pain in the abdomen or lower back.  Regular uterine contractions that may feel like tightening of the abdomen.  A feeling of increased pressure in the pelvis.  Increased watery  or bloody mucus discharge from the vagina.  Water breaking (ruptured amniotic sac). Why is it important to recognize signs of preterm labor? It is important to recognize signs of preterm labor because babies who are born prematurely may not be fully developed. This can put them at an increased risk for:  Long-term (chronic) heart and lung problems.  Difficulty immediately after birth with regulating body systems, including blood sugar, body temperature, heart rate, and breathing rate.  Bleeding in the brain.  Cerebral palsy.  Learning difficulties.  Death. These risks are highest for babies who are born before 69 weeks of pregnancy. How is preterm labor treated? Treatment depends on the length of your pregnancy, your condition, and the health of your baby. It may involve:  Having a stitch (suture) placed in your cervix to prevent your cervix from opening too early (cerclage).  Taking or being given medicines, such as: ? Hormone medicines. These may be given early in pregnancy to help support the pregnancy. ? Medicine to stop contractions. ? Medicines to help mature the baby's lungs. These may be prescribed if the risk of delivery is high. ? Medicines to prevent your baby from developing cerebral palsy. If the labor happens before 34 weeks of pregnancy, you may need to stay in the hospital. What should I do if I think I am in preterm labor? If you think that you are going into preterm labor, call your health care provider right away. How can I prevent preterm labor in future pregnancies? To increase your chance of having a full-term pregnancy:  Do not use any tobacco products, such as cigarettes, chewing tobacco, and e-cigarettes. If  you need help quitting, ask your health care provider.  Do not use street drugs or medicines that have not been prescribed to you during your pregnancy.  Talk with your health care provider before taking any herbal supplements, even if you have been  taking them regularly.  Make sure you gain a healthy amount of weight during your pregnancy.  Watch for infection. If you think that you might have an infection, get it checked right away.  Make sure to tell your health care provider if you have gone into preterm labor before. This information is not intended to replace advice given to you by your health care provider. Make sure you discuss any questions you have with your health care provider. Document Revised: 07/09/2018 Document Reviewed: 08/08/2015 Elsevier Patient Education  [place].   Group B Streptococcus Test During Pregnancy Why am I having this test? Routine testing, also called screening, for group B streptococcus (GBS) is recommended for all pregnant women between the 36th and 37th week of pregnancy. GBS is a type of bacteria that can be passed from mother to baby during childbirth. Screening will help guide whether or not you will need treatment during labor and delivery to prevent complications such as:  An infection in your uterus during labor.  An infection in your uterus after delivery.  A serious infection in your baby after delivery, such as pneumonia, meningitis, or sepsis. GBS screening is not often done before 36 weeks of pregnancy unless you go into labor prematurely. What happens if I have group B streptococcus? If testing shows that you have GBS, your health care provider will recommend treatment with IV antibiotics during labor and delivery. This treatment significantly decreases the risk of complications for you and your baby. If you have a planned C-section and you have GBS, you may not need to be treated with antibiotics because GBS is usually passed to babies after labor starts and your water breaks. If you are in labor or your water breaks before your C-section, it is possible for GBS to get into your uterus and be passed to your baby, so you might need treatment. Is there a chance I may not need to  be tested? You may not need to be tested for GBS if:  You have a urine test that shows GBS before 36 to 37 weeks.  You had a baby with GBS infection after a previous delivery. In these cases, you will automatically be treated for GBS during labor and delivery. What is being tested? This test is done to check if you have group B streptococcus in your vagina or rectum. What kind of sample is taken? To collect samples for this test, your health care provider will swab your vagina and rectum with a cotton swab. The sample is then sent to the lab to see if GBS is present. What happens during the test?   You will remove your clothing from the waist down.  You will lie down on an exam table in the same position as you would for a pelvic exam.  Your health care provider will swab your vagina and rectum to collect samples for a culture test.  You will be able to go home after the test and do all your usual activities. How are the results reported? The test results are reported as positive or negative. What do the results mean?  A positive test means you are at risk for passing GBS to your baby during labor and delivery.  Your health care provider will recommend that you are treated with an IV antibiotic during labor and delivery.  A negative test means you are at very low risk of passing GBS to your baby. There is still a low risk of passing GBS to your baby because sometimes test results may report that you do not have a condition when you do (false-negative result) or there is a chance that you may become infected with GBS after the test is done. You most likely will not need to be treated with an antibiotic during labor and delivery. Talk with your health care provider about what your results mean. Questions to ask your health care provider Ask your health care provider, or the department that is doing the test:  When will my results be ready?  How will I get my results?  What are my  treatment options? Summary  Routine testing (screening) for group B streptococcus (GBS) is recommended for all pregnant women between the 36th and 37th week of pregnancy.  GBS is a type of bacteria that can be passed from mother to baby during childbirth.  If testing shows that you have GBS, your health care provider will recommend that you are treated with IV antibiotics during labor and delivery. This treatment almost always prevents infection in newborns. This information is not intended to replace advice given to you by your health care provider. Make sure you discuss any questions you have with your health care provider. Document Revised: 07/08/2018 Document Reviewed: 04/14/2018 Elsevier Patient Education  2020 [employer].   Contraception Choices - www.bedsider.org Contraception, also called birth control, refers to methods or devices that prevent pregnancy. Hormonal methods Contraceptive implant  A contraceptive implant is a thin, plastic tube that contains a hormone. It is inserted into the upper part of the arm. It can remain in place for up to 3 years. Progestin-only injections Progestin-only injections are injections of progestin, a synthetic form of the hormone progesterone. They are given every 3 months by a health care provider. Birth control pills  Birth control pills are pills that contain hormones that prevent pregnancy. They must be taken once a day, preferably at the same time each day. Birth control patch  The birth control patch contains hormones that prevent pregnancy. It is placed on the skin and must be changed once a week for three weeks and removed on the fourth week. A prescription is needed to use this method of contraception. Vaginal ring  A vaginal ring contains hormones that prevent pregnancy. It is placed in the vagina for three weeks and removed on the fourth week. After that, the process is repeated with a new ring. A prescription is needed to use this  method of contraception. Emergency contraceptive Emergency contraceptives prevent pregnancy after unprotected sex. They come in pill form and can be taken up to 5 days after sex. They work best the sooner they are taken after having sex. Most emergency contraceptives are available without a prescription. This method should not be used as your only form of birth control. Barrier methods Female condom  A female condom is a thin sheath that is worn over the penis during sex. Condoms keep sperm from going inside a woman's body. They can be used with a spermicide to increase their effectiveness. They should be disposed after a single use. Female condom  A female condom is a soft, loose-fitting sheath that is put into the vagina before sex. The condom keeps sperm from going inside a  woman's body. They should be disposed after a single use. Diaphragm  A diaphragm is a soft, dome-shaped barrier. It is inserted into the vagina before sex, along with a spermicide. The diaphragm blocks sperm from entering the uterus, and the spermicide kills sperm. A diaphragm should be left in the vagina for 6-8 hours after sex and removed within 24 hours. A diaphragm is prescribed and fitted by a health care provider. A diaphragm should be replaced every 1-2 years, after giving birth, after gaining more than 15 lb (6.8 kg), and after pelvic surgery. Cervical cap  A cervical cap is a round, soft latex or plastic cup that fits over the cervix. It is inserted into the vagina before sex, along with spermicide. It blocks sperm from entering the uterus. The cap should be left in place for 6-8 hours after sex and removed within 48 hours. A cervical cap must be prescribed and fitted by a health care provider. It should be replaced every 2 years. Sponge  A sponge is a soft, circular piece of polyurethane foam with spermicide on it. The sponge helps block sperm from entering the uterus, and the spermicide kills sperm. To use it, you  make it wet and then insert it into the vagina. It should be inserted before sex, left in for at least 6 hours after sex, and removed and thrown away within 30 hours. Spermicides Spermicides are chemicals that kill or block sperm from entering the cervix and uterus. They can come as a cream, jelly, suppository, foam, or tablet. A spermicide should be inserted into the vagina with an applicator at least [id number] minutes before sex to allow time for it to work. The process must be repeated every time you have sex. Spermicides do not require a prescription. Intrauterine contraception Intrauterine device (IUD) An IUD is a T-shaped device that is put in a woman's uterus. There are two types:  Hormone IUD.This type contains progestin, a synthetic form of the hormone progesterone. This type can stay in place for 3-5 years.  Copper IUD.This type is wrapped in copper wire. It can stay in place for 10 years.  Permanent methods of contraception Female tubal ligation In this method, a woman's fallopian tubes are sealed, tied, or blocked during surgery to prevent eggs from traveling to the uterus. Hysteroscopic sterilization In this method, a small, flexible insert is placed into each fallopian tube. The inserts cause scar tissue to form in the fallopian tubes and block them, so sperm cannot reach an egg. The procedure takes about 3 months to be effective. Another form of birth control must be used during those 3 months. Female sterilization This is a procedure to tie off the tubes that carry sperm (vasectomy). After the procedure, the man can still ejaculate fluid (semen). Natural planning methods Natural family planning In this method, a couple does not have sex on days when the woman could become pregnant. Calendar method This means keeping track of the length of each menstrual cycle, identifying the days when pregnancy can happen, and not having sex on those days. Ovulation method In this method, a couple  avoids sex during ovulation. Symptothermal method This method involves not having sex during ovulation. The woman typically checks for ovulation by watching changes in her temperature and in the consistency of cervical mucus. Post-ovulation method In this method, a couple waits to have sex until after ovulation. Summary  Contraception, also called birth control, means methods or devices that prevent pregnancy.  Hormonal methods of contraception  include implants, injections, pills, patches, vaginal rings, and emergency contraceptives.  Barrier methods of contraception can include female condoms, female condoms, diaphragms, cervical caps, sponges, and spermicides.  There are two types of IUDs (intrauterine devices). An IUD can be put in a woman's uterus to prevent pregnancy for 3-5 years.  Permanent sterilization can be done through a procedure for males, females, or both.  Natural family planning methods involve not having sex on days when the woman could become pregnant. This information is not intended to replace advice given to you by your health care provider. Make sure you discuss any questions you have with your health care provider. Document Revised: 03/19/2017 Document Reviewed: 04/19/2016 Elsevier Patient Education  2020 [place] for Pregnant Women While you are pregnant, your body requires additional nutrition to help support your growing baby. You also have a higher need for some vitamins and minerals, such as folic acid, calcium, iron, and vitamin D. Eating a healthy, well-balanced diet is very important for your health and your baby's health. Your need for extra calories varies for the three 46-month segments of your pregnancy (trimesters). For most women, it is recommended to consume:  150 extra calories a day during the first trimester.  300 extra calories a day during the second trimester.  300 extra calories a day during the third trimester. What are  tips for following this plan?   Do not try to lose weight or go on a diet during pregnancy.  Limit your overall intake of foods that have "empty calories." These are foods that have little nutritional value, such as sweets, desserts, candies, and sugar-sweetened beverages.  Eat a variety of foods (especially fruits and vegetables) to get a full range of vitamins and minerals.  Take a prenatal vitamin to help meet your additional vitamin and mineral needs during pregnancy, specifically for folic acid, iron, calcium, and vitamin D.  Remember to stay active. Ask your health care provider what types of exercise and activities are safe for you.  Practice good food safety and cleanliness. Wash your hands before you eat and after you prepare raw meat. Wash all fruits and vegetables well before peeling or eating. Taking these actions can help to prevent food-borne illnesses that can be very dangerous to your baby, such as listeriosis. Ask your health care provider for more information about listeriosis. What does 150 extra calories look like? Healthy options that provide 150 extra calories each day could be any of the following:  6-8 oz (170-230 g) of plain low-fat yogurt with  cup of berries.  1 apple with 2 teaspoons (11 g) of peanut butter.  Cut-up vegetables with  cup (60 g) of hummus.  8 oz (230 mL) or 1 cup of low-fat chocolate milk.  1 stick of string cheese with 1 medium orange.  1 peanut butter and jelly sandwich that is made with one slice of whole-wheat bread and 1 tsp (5 g) of peanut butter. For 300 extra calories, you could eat two of those healthy options each day. What is a healthy amount of weight to gain? The right amount of weight gain for you is based on your BMI before you became pregnant. If your BMI:  Was less than 18 (underweight), you should gain 28-40 lb (13-18 kg).  Was 18-24.9 (normal), you should gain 25-35 lb (11-16 kg).  Was 25-29.9 (overweight), you should  gain 15-25 lb (7-11 kg).  Was 30 or greater (obese), you should gain 11-20 lb (  5-9 kg). What if I am having twins or multiples? Generally, if you are carrying twins or multiples:  You may need to eat 300-600 extra calories a day.  The recommended range for total weight gain is 25-54 lb (11-25 kg), depending on your BMI before pregnancy.  Talk with your health care provider to find out about nutritional needs, weight gain, and exercise that is right for you. What foods can I eat?  Fruits All fruits. Eat a variety of colors and types of fruit. Remember to wash your fruits well before peeling or eating. Vegetables All vegetables. Eat a variety of colors and types of vegetables. Remember to wash your vegetables well before peeling or eating. Grains All grains. Choose whole grains, such as whole-wheat bread, oatmeal, or brown rice. Meats and other protein foods Lean meats, including chicken, [place], fish, and lean cuts of beef, veal, or pork. If you eat fish or seafood, choose options that are higher in omega-3 fatty acids and lower in mercury, such as salmon, herring, mussels, trout, sardines, pollock, shrimp, crab, and lobster. Tofu. Tempeh. Beans. Eggs. Peanut butter and other nut butters. Make sure that all meats, poultry, and eggs are cooked to food-safe temperatures or "well-done." Two or more servings of fish are recommended each week in order to get the most benefits from omega-3 fatty acids that are found in seafood. Choose fish that are lower in mercury. You can find more information online:  [employer]Gaming.ch Dairy Pasteurized milk and milk alternatives (such as almond milk). Pasteurized yogurt and pasteurized cheese. Cottage cheese. Sour cream. Beverages Water. Juices that contain 100% fruit juice or vegetable juice. Caffeine-free teas and decaffeinated coffee. Drinks that contain caffeine are okay to drink, but it is better to avoid caffeine. Keep your total caffeine intake to less than  200 mg each day (which is 12 oz or 355 mL of coffee, tea, or soda) or the limit as told by your health care provider. Fats and oils Fats and oils are okay to include in moderation. Sweets and desserts Sweets and desserts are okay to include in moderation. Seasoning and other foods All pasteurized condiments. The items listed above may not be a complete list of foods and beverages you can eat. Contact a dietitian for more information. What foods are not recommended? Fruits Unpasteurized fruit juices. Vegetables Raw (unpasteurized) vegetable juices. Meats and other protein foods Lunch meats, bologna, hot dogs, or other deli meats. (If you must eat those meats, reheat them until they are steaming hot.) Refrigerated pat, meat spreads from a meat counter, smoked seafood that is found in the refrigerated section of a store. Raw or undercooked meats, poultry, and eggs. Raw fish, such as sushi or sashimi. Fish that have high mercury content, such as tilefish, shark, swordfish, and king mackerel. To learn more about mercury in fish, talk with your health care provider or look for online resources, such as:  [employer].ch Dairy Raw (unpasteurized) milk and any foods that have raw milk in them. Soft cheeses, such as feta, queso blanco, queso fresco, Brie, Camembert cheeses, blue-veined cheeses, and Panela cheese (unless it is made with pasteurized milk, which must be stated on the label). Beverages Alcohol. Sugar-sweetened beverages, such as sodas, teas, or energy drinks. Seasoning and other foods Homemade fermented foods and drinks, such as pickles, sauerkraut, or kombucha drinks. (Store-bought pasteurized versions of these are okay.) Salads that are made in a store or deli, such as ham salad, chicken salad, egg salad, tuna salad, and seafood salad.  The items listed above may not be a complete list of foods and beverages you should avoid. Contact a dietitian for more information. Where to find more  information To calculate the number of calories you need based on your height, weight, and activity level, you can use an online calculator such as:  MobileTransition.ch To calculate how much weight you should gain during pregnancy, you can use an online pregnancy weight gain calculator such as:  StreamingFood.com.cy Summary  While you are pregnant, your body requires additional nutrition to help support your growing baby.  Eat a variety of foods, especially fruits and vegetables to get a full range of vitamins and minerals.  Practice good food safety and cleanliness. Wash your hands before you eat and after you prepare raw meat. Wash all fruits and vegetables well before peeling or eating. Taking these actions can help to prevent food-borne illnesses, such as listeriosis, that can be very dangerous to your baby.  Do not eat raw meat or fish. Do not eat fish that have high mercury content, such as tilefish, shark, swordfish, and king mackerel. Do not eat unpasteurized (raw) dairy.  Take a prenatal vitamin to help meet your additional vitamin and mineral needs during pregnancy, specifically for folic acid, iron, calcium, and vitamin D. This information is not intended to replace advice given to you by your health care provider. Make sure you discuss any questions you have with your health care provider. Document Revised: 08/05/2018 Document Reviewed: 12/12/2016 Elsevier Patient Education  [place] of Pregnancy The third trimester is from week 28 through week 40 (months 7 through 9). The third trimester is a time when the unborn baby (fetus) is growing rapidly. At the end of the ninth month, the fetus is about 20 inches in length and weighs 6-10 pounds. Body changes during your third trimester Your body will continue to go through many changes during pregnancy. The changes vary from woman to woman. During the third  trimester:  Your weight will continue to increase. You can expect to gain 25-35 pounds (11-16 kg) by the end of the pregnancy.  You may begin to get stretch marks on your hips, abdomen, and breasts.  You may urinate more often because the fetus is moving lower into your pelvis and pressing on your bladder.  You may develop or continue to have heartburn. This is caused by increased hormones that slow down muscles in the digestive tract.  You may develop or continue to have constipation because increased hormones slow digestion and cause the muscles that push waste through your intestines to relax.  You may develop hemorrhoids. These are swollen veins (varicose veins) in the rectum that can itch or be painful.  You may develop swollen, bulging veins (varicose veins) in your legs.  You may have increased body aches in the pelvis, back, or thighs. This is due to weight gain and increased hormones that are relaxing your joints.  You may have changes in your hair. These can include thickening of your hair, rapid growth, and changes in texture. Some women also have hair loss during or after pregnancy, or hair that feels dry or thin. Your hair will most likely return to normal after your baby is born.  Your breasts will continue to grow and they will continue to become tender. A yellow fluid (colostrum) may leak from your breasts. This is the first milk you are producing for your baby.  Your belly button may stick out.  You  may notice more swelling in your hands, face, or ankles.  You may have increased tingling or numbness in your hands, arms, and legs. The skin on your belly may also feel numb.  You may feel short of breath because of your expanding uterus.  You may have more problems sleeping. This can be caused by the size of your belly, increased need to urinate, and an increase in your body's metabolism.  You may notice the fetus "dropping," or moving lower in your abdomen  (lightening).  You may have increased vaginal discharge.  You may notice your joints feel loose and you may have pain around your pelvic bone. What to expect at prenatal visits You will have prenatal exams every 2 weeks until week 36. Then you will have weekly prenatal exams. During a routine prenatal visit:  You will be weighed to make sure you and the baby are growing normally.  Your blood pressure will be taken.  Your abdomen will be measured to track your baby's growth.  The fetal heartbeat will be listened to.  Any test results from the previous visit will be discussed.  You may have a cervical check near your due date to see if your cervix has softened or thinned (effaced).  You will be tested for Group B streptococcus. This happens between 35 and 37 weeks. Your health care provider may ask you:  What your birth plan is.  How you are feeling.  If you are feeling the baby move.  If you have had any abnormal symptoms, such as leaking fluid, bleeding, severe headaches, or abdominal cramping.  If you are using any tobacco products, including cigarettes, chewing tobacco, and electronic cigarettes.  If you have any questions. Other tests or screenings that may be performed during your third trimester include:  Blood tests that check for low iron levels (anemia).  Fetal testing to check the health, activity level, and growth of the fetus. Testing is done if you have certain medical conditions or if there are problems during the pregnancy.  Nonstress test (NST). This test checks the health of your baby to make sure there are no signs of problems, such as the baby not getting enough oxygen. During this test, a belt is placed around your belly. The baby is made to move, and its heart rate is monitored during movement. What is false labor? False labor is a condition in which you feel small, irregular tightenings of the muscles in the womb (contractions) that usually go away with  rest, changing position, or drinking water. These are called Braxton Hicks contractions. Contractions may last for hours, days, or even weeks before true labor sets in. If contractions come at regular intervals, become more frequent, increase in intensity, or become painful, you should see your health care provider. What are the signs of labor?  Abdominal cramps.  Regular contractions that start at 10 minutes apart and become stronger and more frequent with time.  Contractions that start on the top of the uterus and spread down to the lower abdomen and back.  Increased pelvic pressure and dull back pain.  A watery or bloody mucus discharge that comes from the vagina.  Leaking of amniotic fluid. This is also known as your "water breaking." It could be a slow trickle or a gush. Let your health care provider know if it has a color or strange odor. If you have any of these signs, call your health care provider right away, even if it is before your  due date. Follow these instructions at home: Medicines  Follow your health care provider's instructions regarding medicine use. Specific medicines may be either safe or unsafe to take during pregnancy.  Take a prenatal vitamin that contains at least 600 micrograms (mcg) of folic acid.  If you develop constipation, try taking a stool softener if your health care provider approves. Eating and drinking   Eat a balanced diet that includes fresh fruits and vegetables, whole grains, good sources of protein such as meat, eggs, or tofu, and low-fat dairy. Your health care provider will help you determine the amount of weight gain that is right for you.  Avoid raw meat and uncooked cheese. These carry germs that can cause birth defects in the baby.  If you have low calcium intake from food, talk to your health care provider about whether you should take a daily calcium supplement.  Eat four or five small meals rather than three large meals a  day.  Limit foods that are high in fat and processed sugars, such as fried and sweet foods.  To prevent constipation: ? Drink enough fluid to keep your urine clear or pale yellow. ? Eat foods that are high in fiber, such as fresh fruits and vegetables, whole grains, and beans. Activity  Exercise only as directed by your health care provider. Most women can continue their usual exercise routine during pregnancy. Try to exercise for 30 minutes at least 5 days a week. Stop exercising if you experience uterine contractions.  Avoid heavy lifting.  Do not exercise in extreme heat or humidity, or at high altitudes.  Wear low-heel, comfortable shoes.  Practice good posture.  You may continue to have sex unless your health care provider tells you otherwise. Relieving pain and discomfort  Take frequent breaks and rest with your legs elevated if you have leg cramps or low back pain.  Take warm sitz baths to soothe any pain or discomfort caused by hemorrhoids. Use hemorrhoid cream if your health care provider approves.  Wear a good support bra to prevent discomfort from breast tenderness.  If you develop varicose veins: ? Wear support pantyhose or compression stockings as told by your healthcare provider. ? Elevate your feet for 15 minutes, 3-4 times a day. Prenatal care  Write down your questions. Take them to your prenatal visits.  Keep all your prenatal visits as told by your health care provider. This is important. Safety  Wear your seat belt at all times when driving.  Make a list of emergency phone numbers, including numbers for family, friends, the hospital, and police and fire departments. General instructions  Avoid cat litter boxes and soil used by cats. These carry germs that can cause birth defects in the baby. If you have a cat, ask someone to clean the litter box for you.  Do not travel far distances unless it is absolutely necessary and only with the approval of your  health care provider.  Do not use hot tubs, steam rooms, or saunas.  Do not drink alcohol.  Do not use any products that contain nicotine or tobacco, such as cigarettes and e-cigarettes. If you need help quitting, ask your health care provider.  Do not use any medicinal herbs or unprescribed drugs. These chemicals affect the formation and growth of the baby.  Do not douche or use tampons or scented sanitary pads.  Do not cross your legs for long periods of time.  To prepare for the arrival of your baby: ? Take prenatal  classes to understand, practice, and ask questions about labor and delivery. ? Make a trial run to the hospital. ? Visit the hospital and tour the maternity area. ? Arrange for maternity or paternity leave through employers. ? Arrange for family and friends to take care of pets while you are in the hospital. ? Purchase a rear-facing car seat and make sure you know how to install it in your car. ? Pack your hospital bag. ? Prepare the baby's nursery. Make sure to remove all pillows and stuffed animals from the baby's crib to prevent suffocation.  Visit your dentist if you have not gone during your pregnancy. Use a soft toothbrush to brush your teeth and be gentle when you floss. Contact a health care provider if:  You are unsure if you are in labor or if your water has broken.  You become dizzy.  You have mild pelvic cramps, pelvic pressure, or nagging pain in your abdominal area.  You have lower back pain.  You have persistent nausea, vomiting, or diarrhea.  You have an unusual or bad smelling vaginal discharge.  You have pain when you urinate. Get help right away if:  Your water breaks before 37 weeks.  You have regular contractions less than 5 minutes apart before 37 weeks.  You have a fever.  You are leaking fluid from your vagina.  You have spotting or bleeding from your vagina.  You have severe abdominal pain or cramping.  You have rapid  weight loss or weight gain.  You have shortness of breath with chest pain.  You notice sudden or extreme swelling of your face, hands, ankles, feet, or legs.  Your baby makes fewer than 10 movements in 2 hours.  You have severe headaches that do not go away when you take medicine.  You have vision changes. Summary  The third trimester is from week 28 through week 40, months 7 through 9. The third trimester is a time when the unborn baby (fetus) is growing rapidly.  During the third trimester, your discomfort may increase as you and your baby continue to gain weight. You may have abdominal, leg, and back pain, sleeping problems, and an increased need to urinate.  During the third trimester your breasts will keep growing and they will continue to become tender. A yellow fluid (colostrum) may leak from your breasts. This is the first milk you are producing for your baby.  False labor is a condition in which you feel small, irregular tightenings of the muscles in the womb (contractions) that eventually go away. These are called Braxton Hicks contractions. Contractions may last for hours, days, or even weeks before true labor sets in.  Signs of labor can include: abdominal cramps; regular contractions that start at 10 minutes apart and become stronger and more frequent with time; watery or bloody mucus discharge that comes from the vagina; increased pelvic pressure and dull back pain; and leaking of amniotic fluid. This information is not intended to replace advice given to you by your health care provider. Make sure you discuss any questions you have with your health care provider. Document Revised: 07/08/2018 Document Reviewed: 04/22/2016 Elsevier Patient Education  [place].

## 2019-06-08 ENCOUNTER — Encounter: Payer: Self-pay | Admitting: Family Medicine

## 2019-06-08 ENCOUNTER — Other Ambulatory Visit (HOSPITAL_COMMUNITY)
Admission: RE | Admit: 2019-06-08 | Discharge: 2019-06-08 | Disposition: A | Discharge status: Home / Self Care | Payer: Medicaid Other | Source: Ambulatory Visit | Attending: Student | Admitting: Student

## 2019-06-08 ENCOUNTER — Other Ambulatory Visit: Payer: Self-pay

## 2019-06-08 ENCOUNTER — Ambulatory Visit (INDEPENDENT_AMBULATORY_CARE_PROVIDER_SITE_OTHER): Payer: Medicaid Other | Admitting: Student

## 2019-06-08 VITALS — BP 125/73 | HR 101 | Wt 161.9 lb

## 2019-06-08 DIAGNOSIS — Z3A36 36 weeks gestation of pregnancy: Secondary | ICD-10-CM

## 2019-06-08 DIAGNOSIS — Z3493 Encounter for supervision of normal pregnancy, unspecified, third trimester: Secondary | ICD-10-CM

## 2019-06-08 NOTE — Patient Instructions (Signed)

## 2019-06-08 NOTE — Progress Notes (Signed)
   PRENATAL VISIT NOTE  Subjective:  Sally Crosby is a 29 y.o. G1P0 at [redacted]w[redacted]d being seen today for ongoing prenatal care.  She is currently monitored for the following issues for this low-risk pregnancy and has Supervision of low-risk pregnancy; History of herpes genitalis; Uterine fibroid in pregnancy; and Alpha thalassemia silent carrier on their problem list.  Patient reports no complaints.  Contractions: Irritability. Vag. Bleeding: None.  Movement: Present. Denies leaking of fluid.   The following portions of the patient's history were reviewed and updated as appropriate: allergies, current medications, past family history, past medical history, past social history, past surgical history and problem list.   Objective:   Vitals:   06/08/19 0836  BP: 125/73  Pulse: (!) 101  Weight: 161 lb 14.4 oz (73.4 kg)    Fetal Status: Fetal Heart Rate (bpm): 143 Fundal Height: 35 cm Movement: Present  Presentation: Vertex  General:  Alert, oriented and cooperative. Patient is in no acute distress.  Skin: Skin is warm and dry. No rash noted.   Cardiovascular: Normal heart rate noted  Respiratory: Normal respiratory effort, no problems with respiration noted  Abdomen: Soft, gravid, appropriate for gestational age.  Pain/Pressure: Present     Pelvic: Cervical exam performed Dilation: Closed Effacement (%): 0 Station: -3  Extremities: Normal range of motion.  Edema: None  Mental Status: Normal mood and affect. Normal behavior. Normal judgment and thought content.   Assessment and Plan:  Pregnancy: G1P0 at [redacted]w[redacted]d 1. Encounter for supervision of low-risk pregnancy in third trimester  - GC/Chlamydia probe amp ([place])not at [facility] - Culture, beta strep (group b only)  Term labor symptoms and general obstetric precautions including but not limited to vaginal bleeding, contractions, leaking of fluid and fetal movement were reviewed in detail with the patient. Please refer to After Visit  Summary for other counseling recommendations.   Return in about 1 week (around 06/15/2019) for Routine OB, virtual.  No future appointments.  Jorje Guild, NP

## 2019-06-09 ENCOUNTER — Encounter: Payer: Self-pay | Admitting: Student

## 2019-06-09 LAB — GC/CHLAMYDIA PROBE AMP (~~LOC~~) NOT AT ARMC
Chlamydia: NEGATIVE
Comment: NEGATIVE
Comment: NORMAL
Neisseria Gonorrhea: NEGATIVE

## 2019-06-12 LAB — CULTURE, BETA STREP (GROUP B ONLY): Strep Gp B Culture: NEGATIVE

## 2019-06-14 ENCOUNTER — Encounter: Payer: Self-pay | Admitting: Women's Health

## 2019-06-14 ENCOUNTER — Telehealth (INDEPENDENT_AMBULATORY_CARE_PROVIDER_SITE_OTHER): Payer: Commercial Managed Care - PPO | Admitting: Women's Health

## 2019-06-14 VITALS — BP 122/78 | HR 98

## 2019-06-14 DIAGNOSIS — R12 Heartburn: Secondary | ICD-10-CM

## 2019-06-14 DIAGNOSIS — Z3A37 37 weeks gestation of pregnancy: Secondary | ICD-10-CM

## 2019-06-14 DIAGNOSIS — Z3493 Encounter for supervision of normal pregnancy, unspecified, third trimester: Secondary | ICD-10-CM

## 2019-06-14 DIAGNOSIS — D259 Leiomyoma of uterus, unspecified: Secondary | ICD-10-CM

## 2019-06-14 DIAGNOSIS — D563 Thalassemia minor: Secondary | ICD-10-CM

## 2019-06-14 DIAGNOSIS — O3413 Maternal care for benign tumor of corpus uteri, third trimester: Secondary | ICD-10-CM

## 2019-06-14 DIAGNOSIS — O26893 Other specified pregnancy related conditions, third trimester: Secondary | ICD-10-CM | POA: Diagnosis not present

## 2019-06-14 DIAGNOSIS — Z8619 Personal history of other infectious and parasitic diseases: Secondary | ICD-10-CM

## 2019-06-14 NOTE — Patient Instructions (Addendum)
Heartburn During Pregnancy Heartburn is a type of pain or discomfort that can happen in the throat or chest. It is often described as a burning sensation. Heartburn is common during pregnancy because:  A hormone (progesterone) that is released during pregnancy may relax the valve (lower esophageal sphincter, or LES) that separates the esophagus from the stomach. This allows stomach acid to move up into the esophagus, causing heartburn.  The uterus gets larger and pushes up on the stomach, which pushes more acid into the esophagus. This is especially true in the later stages of pregnancy. Heartburn usually goes away or gets better after giving birth. What are the causes? Heartburn is caused by stomach acid backing up into the esophagus (reflux). Reflux can be triggered by:  Changing hormone levels.  Large meals.  Certain foods and beverages, such as coffee, chocolate, onions, and peppermint.  Exercise.  Increased stomach acid production. What increases the risk? You are more likely to experience heartburn during pregnancy if you:  Had heartburn prior to becoming pregnant.  Have been pregnant more than once before.  Are overweight or obese. The likelihood that you will get heartburn also increases as you get farther along in your pregnancy, especially during the last trimester. What are the signs or symptoms? Symptoms of this condition include:  Burning pain in the chest or lower throat.  Bitter taste in the mouth.  Coughing.  Problems swallowing.  Vomiting.  Hoarse voice.  Asthma. Symptoms may get worse when you lie down or bend over. Symptoms are often worse at night. How is this diagnosed? This condition is diagnosed based on:  Your medical history.  Your symptoms.  Blood tests to check for a certain type of bacteria associated with heartburn.  Whether taking heartburn medicine relieves your symptoms.  Examination of the stomach and esophagus using a tube with  a light and camera on the end (endoscopy). How is this treated? Treatment varies depending on how severe your symptoms are. Your health care provider may recommend:  Over-the-counter medicines (antacids or acid reducers) for mild heartburn.  Prescription medicines to decrease stomach acid or to protect your stomach lining.  Certain changes in your diet.  Raising the head of your bed so it is higher than the foot of the bed. This helps prevent stomach acid from backing up into the esophagus when you are lying down. Follow these instructions at home: Eating and drinking  Do not drink alcohol during your pregnancy.  Identify foods and beverages that make your symptoms worse, and avoid them.  Beverages that you may want to avoid include: ? Coffee and tea (with or without caffeine). ? Energy drinks and sports drinks. ? Carbonated drinks or sodas. ? Citrus fruit juices.  Foods that you may want to avoid include: ? Chocolate and cocoa. ? Peppermint and mint flavorings. ? Garlic, onions, and horseradish. ? Spicy and acidic foods, including peppers, chili powder, curry powder, vinegar, hot sauces, and barbecue sauce. ? Citrus fruits, such as oranges, lemons, and limes. ? Tomato-based foods, such as red sauce, chili, and salsa. ? Fried and fatty foods, such as donuts, french fries, potato chips, and high-fat dressings. ? High-fat meats, such as hot dogs, cold cuts, sausage, ham, and bacon. ? High-fat dairy items, such as whole milk, butter, and cheese.  Eat small, frequent meals instead of large meals.  Avoid drinking large amounts of liquid with your meals.  Avoid eating meals during the 2-3 hours before bedtime.  Avoid lying down right   after you eat.  Do not exercise right after you eat. Medicines  Take over-the-counter and prescription medicines only as told by your health care provider.  Do not take aspirin, ibuprofen, or other NSAIDs unless your health care provider tells  you to do that.  You may be instructed to avoid medicines that contain sodium bicarbonate. General instructions   If directed, raise the head of your bed about 6 inches (15 cm) by putting blocks under the legs. Sleeping with more pillows does not effectively relieve heartburn because it only changes the position of your head.  Do not use any products that contain nicotine or tobacco, such as cigarettes and e-cigarettes. If you need help quitting, ask your health care provider.  Wear loose-fitting clothing.  Try to reduce your stress, such as with yoga or meditation. If you need help managing stress, ask your health care provider.  Maintain a healthy weight. If you are overweight, work with your health care provider to safely lose weight.  Keep all follow-up visits as told by your health care provider. This is important. Contact a health care provider if:  You develop new symptoms.  Your symptoms do not improve with treatment.  You have unexplained weight loss.  You have difficulty swallowing.  You make loud sounds when you breathe (wheeze).  You have a cough that does not go away.  You have frequent heartburn for more than 2 weeks.  You have nausea or vomiting that does not get better with treatment.  You have pain in your abdomen. Get help right away if:  You have severe chest pain that spreads to your arm, neck, or jaw.  You feel sweaty, dizzy, or light-headed.  You have shortness of breath.  You have pain when swallowing.  You vomit, and your vomit looks like blood or coffee grounds.  Your stool is bloody or black. This information is not intended to replace advice given to you by your health care provider. Make sure you discuss any questions you have with your health care provider. Document Revised: 06/15/2017 Document Reviewed: 12/03/2015 Elsevier Patient Education  [place] Medications in Pregnancy    Acne: Benzoyl  Peroxide Salicylic Acid  Backache/Headache: Tylenol: 2 regular strength every 4 hours OR              2 Extra strength every 6 hours  Colds/Coughs/Allergies: Benadryl (alcohol free) 25 mg every 6 hours as needed Breath right strips Claritin Cepacol throat lozenges Chloraseptic throat spray Cold-Eeze- up to three times per day Cough drops, alcohol free Flonase (by prescription only) Guaifenesin Mucinex Robitussin DM (plain only, alcohol free) Saline nasal spray/drops Sudafed (pseudoephedrine) & Actifed ** use only after [redacted] weeks gestation and if you do not have high blood pressure Tylenol Vicks Vaporub Zinc lozenges Zyrtec   Constipation: Colace Ducolax suppositories Fleet enema Glycerin suppositories Metamucil Milk of magnesia Miralax Senokot Smooth move tea  Diarrhea: Kaopectate Imodium A-D  *NO pepto Bismol  Hemorrhoids: Anusol Anusol HC Preparation H Tucks  Indigestion: Tums Maalox Mylanta Zantac  Pepcid  Insomnia: Benadryl (alcohol free) 25mg  every 6 hours as needed Tylenol PM Unisom, no Gelcaps  Leg Cramps: Tums MagGel  Nausea/Vomiting:  Bonine Dramamine Emetrol Ginger extract Sea bands Meclizine  Nausea medication to take during pregnancy:  Unisom (doxylamine succinate 25 mg tablets) Take one tablet daily at bedtime. If symptoms are not adequately controlled,  the dose can be increased to a maximum recommended dose of two tablets daily (1/2 tablet in the morning, 1/2 tablet mid-afternoon and one at bedtime). Vitamin B6 100mg  tablets. Take one tablet twice a day (up to 200 mg per day).  Skin Rashes: Aveeno products Benadryl cream or 25mg  every 6 hours as needed Calamine Lotion 1% cortisone cream  Yeast infection: Gyne-lotrimin 7 Monistat 7   **If taking multiple medications, please check labels to avoid duplicating the same active ingredients **take medication as directed on the label ** Do not exceed 4000 mg of tylenol in  24 hours **Do not take medications that contain aspirin or ibuprofen   Maternity Assessment Unit (MAU)  The Maternity Assessment Unit (MAU) is located at the [facility] and [place] at [facility]. The address is: [address], [place], [place], [place] [address]. Please see map below for additional directions.    The Maternity Assessment Unit is designed to help you during your pregnancy, and for up to 6 weeks after delivery, with any pregnancy- or postpartum-related emergencies, if you think you are in labor, or if your water has broken. For example, if you experience nausea and vomiting, vaginal bleeding, severe abdominal or pelvic pain, elevated blood pressure or other problems related to your pregnancy or postpartum time, please come to the Maternity Assessment Unit for assistance.   Signs and Symptoms of Labor Labor is your body's natural process of moving your baby, placenta, and umbilical cord out of your uterus. The process of labor usually starts when your baby is full-term, between 48 and 40 weeks of pregnancy. How will I know when I am close to going into labor? As your body prepares for labor and the birth of your baby, you may notice the following symptoms in the weeks and days before true labor starts:  Having a strong desire to get your home ready to receive your new baby. This is called nesting. Nesting may be a sign that labor is approaching, and it may occur several weeks before birth. Nesting may involve cleaning and organizing your home.  Passing a small amount of thick, bloody mucus out of your vagina (normal bloody show or losing your mucus plug). This may happen more than a week before labor begins, or it might occur right before labor begins as the opening of the cervix starts to widen (dilate). For some women, the entire mucus plug passes at once. For others, smaller portions of the mucus plug may gradually pass over several days.  Your baby  moving (dropping) lower in your pelvis to get into position for birth (lightening). When this happens, you may feel more pressure on your bladder and pelvic bone and less pressure on your ribs. This may make it easier to breathe. It may also cause you to need to urinate more often and have problems with bowel movements.  Having "practice contractions" (Braxton Hicks contractions) that occur at irregular (unevenly spaced) intervals that are more than 10 minutes apart. This is also called false labor. False labor contractions are common after exercise or sexual activity, and they will stop if you change position, rest, or drink fluids. These contractions are usually mild and do not get stronger over time. They may feel like: ? A backache or back pain. ? Mild cramps, similar to menstrual cramps. ? Tightening or pressure in your abdomen. Other early symptoms that labor may be starting soon include:  Nausea or loss of appetite.  Diarrhea.  Having a  sudden burst of energy, or feeling very tired.  Mood changes.  Having trouble sleeping. How will I know when labor has begun? Signs that true labor has begun may include:  Having contractions that come at regular (evenly spaced) intervals and increase in intensity. This may feel like more intense tightening or pressure in your abdomen that moves to your back. ? Contractions may also feel like rhythmic pain in your upper thighs or back that comes and goes at regular intervals. ? For first-time mothers, this change in intensity of contractions often occurs at a more gradual pace. ? Women who have given birth before may notice a more rapid progression of contraction changes.  Having a feeling of pressure in the vaginal area.  Your water breaking (rupture of membranes). This is when the sac of fluid that surrounds your baby breaks. When this happens, you will notice fluid leaking from your vagina. This may be clear or blood-tinged. Labor usually starts  within 24 hours of your water breaking, but it may take longer to begin. ? Some women notice this as a gush of fluid. ? Others notice that their underwear repeatedly becomes damp. Follow these instructions at home:   When labor starts, or if your water breaks, call your health care provider or nurse care line. Based on your situation, they will determine when you should go in for an exam.  When you are in early labor, you may be able to rest and manage symptoms at home. Some strategies to try at home include: ? Breathing and relaxation techniques. ? Taking a warm bath or shower. ? Listening to music. ? Using a heating pad on the lower back for pain. If you are directed to use heat:  Place a towel between your skin and the heat source.  Leave the heat on for 20-30 minutes.  Remove the heat if your skin turns bright red. This is especially important if you are unable to feel pain, heat, or cold. You may have a greater risk of getting burned. Get help right away if:  You have painful, regular contractions that are 5 minutes apart or less.  Labor starts before you are [redacted] weeks along in your pregnancy.  You have a fever.  You have a headache that does not go away.  You have bright red blood coming from your vagina.  You do not feel your baby moving.  You have a sudden onset of: ? Severe headache with vision problems. ? Nausea, vomiting, or diarrhea. ? Chest pain or shortness of breath. These symptoms may be an emergency. If your health care provider recommends that you go to the hospital or birth center where you plan to deliver, do not drive yourself. Have someone else drive you, or call emergency services (911 in the [place] Summary  Labor is your body's natural process of moving your baby, placenta, and umbilical cord out of your uterus.  The process of labor usually starts when your baby is full-term, between 1 and 40 weeks of pregnancy.  When labor starts, or if your water  breaks, call your health care provider or nurse care line. Based on your situation, they will determine when you should go in for an exam. This information is not intended to replace advice given to you by your health care provider. Make sure you discuss any questions you have with your health care provider. Document Revised: 12/15/2016 Document Reviewed: 08/22/2016 Elsevier Patient Education  [place].

## 2019-06-14 NOTE — Progress Notes (Signed)
I connected with Linton Flemings on 06/14/19 at  8:35 AM EDT by: MyChart and verified that I am speaking with the correct person using two identifiers.  Patient is located at home and provider is located at [facility].     The purpose of this virtual visit is to provide medical care while limiting exposure to the novel coronavirus. I discussed the limitations, risks, security and privacy concerns of performing an evaluation and management service by MyChart and the availability of in person appointments. I also discussed with the patient that there may be a patient responsible charge related to this service. By engaging in this virtual visit, you consent to the provision of healthcare.  Additionally, you authorize for your insurance to be billed for the services provided during this visit.  The patient expressed understanding and agreed to proceed.  The following staff members participated in the virtual visit:  Vernice Jefferson, NP    PRENATAL VISIT NOTE  Subjective:  Sally Crosby is a 29 y.o. G1P0 at [redacted]w[redacted]d  for phone visit for ongoing prenatal care.  She is currently monitored for the following issues for this low-risk pregnancy and has Supervision of low-risk pregnancy; History of herpes genitalis; Uterine fibroid in pregnancy; and Alpha thalassemia silent carrier on their problem list.  Patient reports no complaints.  Contractions: Irritability. Vag. Bleeding: None.  Movement: Present. Denies leaking of fluid.   The following portions of the patient's history were reviewed and updated as appropriate: allergies, current medications, past family history, past medical history, past social history, past surgical history and problem list.   Objective:   Vitals:   06/14/19 0833  BP: 122/78  Pulse: 98  Self-Obtained  Fetal Status:     Movement: Present     Assessment and Plan:  Pregnancy: G1P0 at [redacted]w[redacted]d  1. Encounter for supervision of low-risk pregnancy in third trimester  2. History of  herpes genitalis -pt reports is taking suppressive therapy 500mg  BID daily  3. Uterine fibroid in pregnancy  4. Alpha thalassemia silent carrier -partner testing completed - FOB negative  5. Heartburn in pregnancy -discussed use of Pepcid for heartburn instead of using TUMS daily and non-pharmacologic measures -safe meds in pregnancy list given  Term labor symptoms and general obstetric precautions including but not limited to vaginal bleeding, contractions, leaking of fluid and fetal movement were reviewed in detail with the patient. I discussed the assessment and treatment plan with the patient. The patient was provided an opportunity to ask questions and all were answered. The patient agreed with the plan and demonstrated an understanding of the instructions. The patient was advised to call back or seek an in-person office evaluation/go to MAU at [facility] for any urgent or concerning symptoms.  Return in about 1 week (around 06/21/2019) for already scheduled for next week.  Future Appointments  Date Time Provider [place]  06/22/2019  8:35 AM Hillard Danker, Ricarda Frame [facility]    Time spent on virtual visit: 10 minutes  Clarisa Fling, NP

## 2019-06-14 NOTE — Progress Notes (Signed)
I connected with  Linton Flemings on 06/14/19 at  8:35 AM EDT by telephone and verified that I am speaking with the correct person using two identifiers.   I discussed the limitations, risks, security and privacy concerns of performing an evaluation and management service by telephone and the availability of in person appointments. I also discussed with the patient that there may be a patient responsible charge related to this service. The patient expressed understanding and agreed to proceed.  Alric Seton, [place] 06/14/2019  8:35 AM

## 2019-06-22 ENCOUNTER — Other Ambulatory Visit: Payer: Self-pay

## 2019-06-22 ENCOUNTER — Ambulatory Visit (INDEPENDENT_AMBULATORY_CARE_PROVIDER_SITE_OTHER): Payer: Medicaid Other

## 2019-06-22 ENCOUNTER — Encounter: Payer: Self-pay | Admitting: Medical

## 2019-06-22 VITALS — BP 126/67 | HR 103 | Wt 165.0 lb

## 2019-06-22 DIAGNOSIS — Z8619 Personal history of other infectious and parasitic diseases: Secondary | ICD-10-CM

## 2019-06-22 DIAGNOSIS — Z3A38 38 weeks gestation of pregnancy: Secondary | ICD-10-CM

## 2019-06-22 DIAGNOSIS — Z3493 Encounter for supervision of normal pregnancy, unspecified, third trimester: Secondary | ICD-10-CM

## 2019-06-22 NOTE — Patient Instructions (Signed)

## 2019-06-22 NOTE — Progress Notes (Signed)
   PRENATAL VISIT NOTE  Subjective:  Sally Crosby is a 29 y.o. G1P0 at [redacted]w[redacted]d who presents today for routine prenatal care.  She is currently being monitored for supervision of a low-risk pregnancy with problems as listed below.  Patient has no pregnancy related concerns and endorses fetal movement.  She denies vaginal concerns including discharge, bleeding, leaking, itching, and burning. She also denies abdominal cramping, but reports some BH contractions.  She complains of ankle edema and back pain.   Patient Active Problem List   Diagnosis Date Noted  . Uterine fibroid in pregnancy 02/17/2019  . Alpha thalassemia silent carrier 02/17/2019  . Supervision of low-risk pregnancy 01/06/2019  . History of herpes genitalis 01/06/2019    The following portions of the patient's history were reviewed and updated as appropriate: allergies, current medications, past family history, past medical history, past social history, past surgical history and problem list. Problem list updated.  Objective:   Vitals:   06/22/19 0849  BP: 126/67  Pulse: (!) 103  Weight: 165 lb (74.8 kg)    Fetal Status: Fetal Heart Rate (bpm): 150   Movement: Present     General:  Alert, oriented and cooperative. Patient is in no acute distress.  Skin: Skin is warm and dry.   Cardiovascular: Regular rate and rhythm.  Respiratory: Normal respiratory effort. CTA-Bilaterally  Abdomen: Soft, gravid, appropriate for gestational age.  Pelvic: Cervical exam performed Dilation: Closed Effacement (%): 0 Station: -3  Extremities: Normal range of motion.  Edema: None  Mental Status: Normal mood and affect. Normal behavior. Normal judgment and thought content.   Assessment and Plan:  Pregnancy: G1P0 at [redacted]w[redacted]d  1. Encounter for supervision of low-risk pregnancy in third trimester -Reassured that complaints are normal during pregnancy. -Reviewed IOL procedure for post dates. -IOL form sent for anticipated date of April 13th  at [facility].  -Plan for outpatient foley bulb on April 12th. -Virtual visit in one week and then in person x 2.  2. History of herpes genitalis -Continue suppression therapy as prescribed.   Term labor symptoms and general obstetric precautions including but not limited to vaginal bleeding, contractions, leaking of fluid and fetal movement were reviewed with the patient.  Please refer to After Visit Summary for other counseling recommendations.  Return in about 1 week (around 06/29/2019) for LR-ROB.  No future appointments.  Maryann Conners, CNM 06/22/2019, 9:17 AM

## 2019-06-22 NOTE — Progress Notes (Signed)
   Induction Assessment Scheduling Form: [facility]ax to [facility]:  [id number]  Fadwa Dionicio                                                                                   DOB:  1990-08-09                                                            MRN:  [id number]                                                                     Phone #:   [phone]                         Provider:  [facility]  GP:  G1P0                                                            Estimated Date of Delivery: 07/05/19  Dating Criteria: LMP    Medical Indications for induction:  Post Dates Admission Date/Time: April 13 at [facility] Gestational age on admission:  58 weeks   Filed Weights   06/22/19 0849  Weight: 165 lb (74.8 kg)   HIV:  Non Reactive (01/20 0915) GBS: Negative/-- (03/10 0851)  Bishop score TBD   Method of induction(proposed):  Outpt Foley Bulb   Scheduling Provider Signature:  Maryann Conners, CNM                                            Today's Date:  06/22/2019

## 2019-06-27 ENCOUNTER — Telehealth: Payer: Self-pay | Admitting: *Deleted

## 2019-06-27 NOTE — Telephone Encounter (Signed)
Pt left VM message stating that she would like her appt on 3/31 to be in person instead of virtual. She would like to have a membrane sweep if possible. I returned call to pt and left a message on her personal VM. I stated that she may come in to the office for her appt on 3/31. She may discuss her request with the provider at that time. Message sent to front office to change appt type on 3/31.

## 2019-06-29 ENCOUNTER — Ambulatory Visit (INDEPENDENT_AMBULATORY_CARE_PROVIDER_SITE_OTHER): Payer: Medicaid Other | Admitting: Family Medicine

## 2019-06-29 ENCOUNTER — Telehealth (HOSPITAL_COMMUNITY): Payer: Self-pay | Admitting: *Deleted

## 2019-06-29 ENCOUNTER — Encounter (HOSPITAL_COMMUNITY): Payer: Self-pay | Admitting: *Deleted

## 2019-06-29 ENCOUNTER — Other Ambulatory Visit: Payer: Self-pay

## 2019-06-29 VITALS — BP 125/82 | HR 99 | Wt 167.0 lb

## 2019-06-29 DIAGNOSIS — Z3A39 39 weeks gestation of pregnancy: Secondary | ICD-10-CM

## 2019-06-29 DIAGNOSIS — Z3493 Encounter for supervision of normal pregnancy, unspecified, third trimester: Secondary | ICD-10-CM

## 2019-06-29 LAB — POCT URINALYSIS DIP (DEVICE)
Bilirubin Urine: NEGATIVE
Glucose, UA: NEGATIVE mg/dL
Ketones, ur: NEGATIVE mg/dL
Nitrite: NEGATIVE
Protein, ur: NEGATIVE mg/dL
Specific Gravity, Urine: 1.025 (ref 1.005–1.030)
Urobilinogen, UA: 0.2 mg/dL (ref 0.0–1.0)
pH: 7 (ref 5.0–8.0)

## 2019-06-29 NOTE — Progress Notes (Signed)
   PRENATAL VISIT NOTE  Subjective:  Sally Crosby is a 29 y.o. G1P0 at [redacted]w[redacted]d being seen today for ongoing prenatal care.  She is currently monitored for the following issues for this low-risk pregnancy and has Supervision of low-risk pregnancy; History of herpes genitalis; Uterine fibroid in pregnancy; and Alpha thalassemia silent carrier on their problem list.  Patient reports no complaints.  Contractions: Irritability. Vag. Bleeding: None.  Movement: Present. Denies leaking of fluid.   The following portions of the patient's history were reviewed and updated as appropriate: allergies, current medications, past family history, past medical history, past social history, past surgical history and problem list.   Objective:   Vitals:   06/29/19 1451  BP: 125/82  Pulse: 99  Weight: 167 lb (75.8 kg)    Fetal Status: Fetal Heart Rate (bpm): 151 Fundal Height: 35 cm Movement: Present  Presentation: Vertex  General:  Alert, oriented and cooperative. Patient is in no acute distress.  Skin: Skin is warm and dry. No rash noted.   Cardiovascular: Normal heart rate noted  Respiratory: Normal respiratory effort, no problems with respiration noted  Abdomen: Soft, gravid, appropriate for gestational age.  Pain/Pressure: Present     Pelvic: Cervical exam performed in the presence of a chaperone Dilation: Closed Effacement (%): 80 Station: -2  Extremities: Normal range of motion.  Edema: None  Mental Status: Normal mood and affect. Normal behavior. Normal judgment and thought content.   Assessment and Plan:  Pregnancy: G1P0 at [redacted]w[redacted]d 1. Encounter for supervision of low-risk pregnancy in third trimester Continue routine prenatal care.   Preterm labor symptoms and general obstetric precautions including but not limited to vaginal bleeding, contractions, leaking of fluid and fetal movement were reviewed in detail with the patient. Please refer to After Visit Summary for other counseling  recommendations.   Return in 1 week (on 07/06/2019).  Future Appointments  Date Time Provider [place]  07/06/2019  8:55 AM Chancy Milroy, MD [facility] [place]  07/09/2019  9:45 AM [facility]-SCREENING [facility]SD[facility] None  07/11/2019  1:15 PM [facility] NST [facility]  07/11/2019  2:15 PM Truett Mainland, DO [facility] [place]  07/12/2019 12:00 AM MC-LD SCHED ROOM [facility]-INDC None    Donnamae Jude, MD

## 2019-06-29 NOTE — Telephone Encounter (Signed)
Preadmission screen  

## 2019-07-06 ENCOUNTER — Other Ambulatory Visit: Payer: Self-pay | Admitting: Advanced Practice Midwife

## 2019-07-06 ENCOUNTER — Ambulatory Visit (INDEPENDENT_AMBULATORY_CARE_PROVIDER_SITE_OTHER): Payer: Commercial Managed Care - PPO | Admitting: Obstetrics and Gynecology

## 2019-07-06 ENCOUNTER — Other Ambulatory Visit: Payer: Self-pay

## 2019-07-06 VITALS — BP 125/84 | HR 106 | Wt 165.1 lb

## 2019-07-06 DIAGNOSIS — Z8619 Personal history of other infectious and parasitic diseases: Secondary | ICD-10-CM

## 2019-07-06 DIAGNOSIS — O480 Post-term pregnancy: Secondary | ICD-10-CM

## 2019-07-06 DIAGNOSIS — Z3493 Encounter for supervision of normal pregnancy, unspecified, third trimester: Secondary | ICD-10-CM

## 2019-07-06 DIAGNOSIS — Z872 Personal history of diseases of the skin and subcutaneous tissue: Secondary | ICD-10-CM

## 2019-07-06 DIAGNOSIS — Z3A40 40 weeks gestation of pregnancy: Secondary | ICD-10-CM | POA: Diagnosis not present

## 2019-07-06 DIAGNOSIS — D563 Thalassemia minor: Secondary | ICD-10-CM

## 2019-07-06 DIAGNOSIS — O99013 Anemia complicating pregnancy, third trimester: Secondary | ICD-10-CM

## 2019-07-06 NOTE — Progress Notes (Signed)
Subjective:  Sally Crosby is a 29 y.o. G1P0 at [redacted]w[redacted]d being seen today for ongoing prenatal care.  She is currently monitored for the following issues for this low-risk pregnancy and has Supervision of low-risk pregnancy; History of herpes genitalis; Uterine fibroid in pregnancy; and Alpha thalassemia silent carrier on their problem list.  Patient reports general discomforts of pregnancy.  Contractions: Irritability. Vag. Bleeding: None.  Movement: Present. Denies leaking of fluid.   The following portions of the patient's history were reviewed and updated as appropriate: allergies, current medications, past family history, past medical history, past social history, past surgical history and problem list. Problem list updated.  Objective:   Vitals:   07/06/19 0919  BP: 125/84  Pulse: (!) 106  Weight: 74.9 kg    Fetal Status: Fetal Heart Rate (bpm): 158   Movement: Present     General:  Alert, oriented and cooperative. Patient is in no acute distress.  Skin: Skin is warm and dry. No rash noted.   Cardiovascular: Normal heart rate noted  Respiratory: Normal respiratory effort, no problems with respiration noted  Abdomen: Soft, gravid, appropriate for gestational age. Pain/Pressure: Present     Pelvic:  Cervical exam performed        Extremities: Normal range of motion.  Edema: Trace  Mental Status: Normal mood and affect. Normal behavior. Normal judgment and thought content.   Urinalysis:      Assessment and Plan:  Pregnancy: G1P0 at [redacted]w[redacted]d  1. Encounter for supervision of low-risk pregnancy in third trimester Labor precautions. NST today IOL scheduled as well as foley placement prior to  2. History of herpes genitalis Continue with suppression  3. Alpha thalassemia silent carrier Partner negative  Term labor symptoms and general obstetric precautions including but not limited to vaginal bleeding, contractions, leaking of fluid and fetal movement were reviewed in detail with  the patient. Please refer to After Visit Summary for other counseling recommendations.  No follow-ups on file.   Chancy Milroy, MD

## 2019-07-06 NOTE — Patient Instructions (Signed)

## 2019-07-07 ENCOUNTER — Other Ambulatory Visit (HOSPITAL_COMMUNITY): Payer: Self-pay | Admitting: Advanced Practice Midwife

## 2019-07-07 ENCOUNTER — Other Ambulatory Visit: Payer: Self-pay | Admitting: Obstetrics and Gynecology

## 2019-07-07 DIAGNOSIS — Z3493 Encounter for supervision of normal pregnancy, unspecified, third trimester: Secondary | ICD-10-CM

## 2019-07-09 ENCOUNTER — Other Ambulatory Visit: Payer: Self-pay

## 2019-07-09 ENCOUNTER — Inpatient Hospital Stay (HOSPITAL_COMMUNITY)
Admission: AD | Admit: 2019-07-09 | Discharge: 2019-07-11 | DRG: 768 | Disposition: A | Discharge status: Home / Self Care | Payer: Commercial Managed Care - PPO | Attending: Obstetrics and Gynecology | Admitting: Obstetrics and Gynecology

## 2019-07-09 ENCOUNTER — Encounter (HOSPITAL_COMMUNITY): Payer: Self-pay | Admitting: Obstetrics & Gynecology

## 2019-07-09 ENCOUNTER — Inpatient Hospital Stay (HOSPITAL_COMMUNITY): Payer: Commercial Managed Care - PPO | Admitting: Anesthesiology

## 2019-07-09 ENCOUNTER — Other Ambulatory Visit (HOSPITAL_COMMUNITY): Payer: Commercial Managed Care - PPO | Attending: Family Medicine

## 2019-07-09 DIAGNOSIS — O134 Gestational [pregnancy-induced] hypertension without significant proteinuria, complicating childbirth: Secondary | ICD-10-CM

## 2019-07-09 DIAGNOSIS — A6000 Herpesviral infection of urogenital system, unspecified: Secondary | ICD-10-CM | POA: Diagnosis present

## 2019-07-09 DIAGNOSIS — O3413 Maternal care for benign tumor of corpus uteri, third trimester: Secondary | ICD-10-CM | POA: Diagnosis present

## 2019-07-09 DIAGNOSIS — Z3493 Encounter for supervision of normal pregnancy, unspecified, third trimester: Secondary | ICD-10-CM

## 2019-07-09 DIAGNOSIS — O9832 Other infections with a predominantly sexual mode of transmission complicating childbirth: Secondary | ICD-10-CM | POA: Diagnosis present

## 2019-07-09 DIAGNOSIS — Z3A40 40 weeks gestation of pregnancy: Secondary | ICD-10-CM

## 2019-07-09 DIAGNOSIS — D259 Leiomyoma of uterus, unspecified: Secondary | ICD-10-CM | POA: Diagnosis present

## 2019-07-09 DIAGNOSIS — Z8619 Personal history of other infectious and parasitic diseases: Secondary | ICD-10-CM | POA: Diagnosis not present

## 2019-07-09 DIAGNOSIS — O139 Gestational [pregnancy-induced] hypertension without significant proteinuria, unspecified trimester: Secondary | ICD-10-CM

## 2019-07-09 DIAGNOSIS — O480 Post-term pregnancy: Secondary | ICD-10-CM | POA: Diagnosis present

## 2019-07-09 DIAGNOSIS — Z8759 Personal history of other complications of pregnancy, childbirth and the puerperium: Secondary | ICD-10-CM

## 2019-07-09 DIAGNOSIS — Z20822 Contact with and (suspected) exposure to covid-19: Secondary | ICD-10-CM | POA: Diagnosis present

## 2019-07-09 DIAGNOSIS — D563 Thalassemia minor: Secondary | ICD-10-CM | POA: Diagnosis present

## 2019-07-09 DIAGNOSIS — O26893 Other specified pregnancy related conditions, third trimester: Secondary | ICD-10-CM | POA: Diagnosis present

## 2019-07-09 DIAGNOSIS — D219 Benign neoplasm of connective and other soft tissue, unspecified: Secondary | ICD-10-CM | POA: Diagnosis present

## 2019-07-09 DIAGNOSIS — O3410 Maternal care for benign tumor of corpus uteri, unspecified trimester: Secondary | ICD-10-CM

## 2019-07-09 LAB — COMPREHENSIVE METABOLIC PANEL
ALT: 21 U/L (ref 0–44)
AST: 42 U/L — ABNORMAL HIGH (ref 15–41)
Albumin: 3.1 g/dL — ABNORMAL LOW (ref 3.5–5.0)
Alkaline Phosphatase: 199 U/L — ABNORMAL HIGH (ref 38–126)
Anion gap: 14 (ref 5–15)
BUN: 10 mg/dL (ref 6–20)
CO2: 18 mmol/L — ABNORMAL LOW (ref 22–32)
Calcium: 9.2 mg/dL (ref 8.9–10.3)
Chloride: 103 mmol/L (ref 98–111)
Creatinine, Ser: 0.68 mg/dL (ref 0.44–1.00)
GFR calc Af Amer: >60 mL/min (ref >60)
GFR calc non Af Amer: >60 mL/min (ref >60)
Glucose, Bld: 96 mg/dL (ref 70–99)
Potassium: 4.1 mmol/L (ref 3.5–5.1)
Sodium: 135 mmol/L (ref 135–145)
Total Bilirubin: 0.6 mg/dL (ref 0.3–1.2)
Total Protein: 7.1 g/dL (ref 6.5–8.1)

## 2019-07-09 LAB — PROTEIN / CREATININE RATIO, URINE
Creatinine, Urine: 69.73 mg/dL
Protein Creatinine Ratio: 0.16 mg/mg{Cre} — ABNORMAL HIGH (ref 0.00–0.15)
Total Protein, Urine: 11 mg/dL

## 2019-07-09 LAB — CBC
HCT: 37.6 % (ref 36.0–46.0)
Hemoglobin: 12 g/dL (ref 12.0–15.0)
MCH: 26.8 pg (ref 26.0–34.0)
MCHC: 31.9 g/dL (ref 30.0–36.0)
MCV: 83.9 fL (ref 80.0–100.0)
Platelets: 182 10*3/uL (ref 150–400)
RBC: 4.48 MIL/uL (ref 3.87–5.11)
RDW: 15.5 % (ref 11.5–15.5)
WBC: 10.7 10*3/uL — ABNORMAL HIGH (ref 4.0–10.5)
nRBC: 0 % (ref 0.0–0.2)

## 2019-07-09 LAB — RPR: RPR Ser Ql: NONREACTIVE

## 2019-07-09 LAB — TYPE AND SCREEN
ABO/RH(D): A POS
Antibody Screen: NEGATIVE

## 2019-07-09 LAB — SARS CORONAVIRUS 2 (TAT 6-24 HRS): SARS Coronavirus 2: NEGATIVE

## 2019-07-09 LAB — ABO/RH: ABO/RH(D): A POS

## 2019-07-09 MED ORDER — FENTANYL CITRATE (PF) 100 MCG/2ML IJ SOLN: 50.0000 ug | INTRAMUSCULAR | Status: DC | PRN

## 2019-07-09 MED ORDER — SIMETHICONE 80 MG PO CHEW: 80.0000 mg | CHEWABLE_TABLET | ORAL | Status: DC | PRN

## 2019-07-09 MED ORDER — PHENYLEPHRINE 40 MCG/ML (10ML) SYRINGE FOR IV PUSH (FOR BLOOD PRESSURE SUPPORT): 80.0000 ug | PREFILLED_SYRINGE | INTRAVENOUS | Status: DC | PRN

## 2019-07-09 MED ORDER — IBUPROFEN 600 MG PO TABS
600.0000 mg | ORAL_TABLET | Freq: Four times a day (QID) | ORAL | Status: DC
  Administered 2019-07-09 – 2019-07-11 (×7): 600 mg via ORAL
  Filled 2019-07-09 (×7): qty 1

## 2019-07-09 MED ORDER — ONDANSETRON HCL 4 MG/2ML IJ SOLN: 4.0000 mg | Freq: Four times a day (QID) | INTRAMUSCULAR | Status: DC | PRN

## 2019-07-09 MED ORDER — LIDOCAINE HCL (PF) 1 % IJ SOLN
30.0000 mL | INTRAMUSCULAR | Status: AC | PRN
  Administered 2019-07-09: 6 mL via SUBCUTANEOUS
  Administered 2019-07-09: 4 mL via SUBCUTANEOUS

## 2019-07-09 MED ORDER — DIPHENHYDRAMINE HCL 50 MG/ML IJ SOLN: 12.5000 mg | INTRAMUSCULAR | Status: DC | PRN

## 2019-07-09 MED ORDER — OXYCODONE-ACETAMINOPHEN 5-325 MG PO TABS: 2.0000 | ORAL_TABLET | ORAL | Status: DC | PRN

## 2019-07-09 MED ORDER — OXYCODONE-ACETAMINOPHEN 5-325 MG PO TABS: 1.0000 | ORAL_TABLET | ORAL | Status: DC | PRN

## 2019-07-09 MED ORDER — ACETAMINOPHEN 325 MG PO TABS: 650.0000 mg | ORAL_TABLET | ORAL | Status: DC | PRN

## 2019-07-09 MED ORDER — OXYCODONE HCL 5 MG PO TABS: 10.0000 mg | ORAL_TABLET | ORAL | Status: DC | PRN

## 2019-07-09 MED ORDER — LACTATED RINGERS IV SOLN: INTRAVENOUS | Status: DC

## 2019-07-09 MED ORDER — DIBUCAINE (PERIANAL) 1 % EX OINT: 1.0000 "application " | TOPICAL_OINTMENT | CUTANEOUS | Status: DC | PRN

## 2019-07-09 MED ORDER — SENNOSIDES-DOCUSATE SODIUM 8.6-50 MG PO TABS
2.0000 | ORAL_TABLET | ORAL | Status: DC
  Administered 2019-07-10 – 2019-07-11 (×2): 2 via ORAL
  Filled 2019-07-09 (×2): qty 2

## 2019-07-09 MED ORDER — SOD CITRATE-CITRIC ACID 500-334 MG/5ML PO SOLN: 30.0000 mL | ORAL | Status: DC | PRN

## 2019-07-09 MED ORDER — LACTATED RINGERS IV SOLN
500.0000 mL | Freq: Once | INTRAVENOUS | Status: AC
  Administered 2019-07-09: 05:00:00 500 mL via INTRAVENOUS

## 2019-07-09 MED ORDER — ONDANSETRON HCL 4 MG/2ML IJ SOLN: 4.0000 mg | INTRAMUSCULAR | Status: DC | PRN

## 2019-07-09 MED ORDER — POLYETHYLENE GLYCOL 3350 17 G PO PACK
17.0000 g | PACK | Freq: Every day | ORAL | Status: DC
  Administered 2019-07-10 – 2019-07-11 (×2): 17 g via ORAL
  Filled 2019-07-09 (×2): qty 1

## 2019-07-09 MED ORDER — COCONUT OIL OIL: 1.0000 "application " | TOPICAL_OIL | Status: DC | PRN

## 2019-07-09 MED ORDER — ACETAMINOPHEN 325 MG PO TABS
650.0000 mg | ORAL_TABLET | ORAL | Status: DC | PRN
  Administered 2019-07-09 – 2019-07-10 (×2): 650 mg via ORAL
  Filled 2019-07-09 (×2): qty 2

## 2019-07-09 MED ORDER — MAGNESIUM HYDROXIDE 400 MG/5ML PO SUSP: 30.0000 mL | ORAL | Status: DC | PRN

## 2019-07-09 MED ORDER — EPHEDRINE 5 MG/ML INJ: 10.0000 mg | INTRAVENOUS | Status: DC | PRN

## 2019-07-09 MED ORDER — FENTANYL-BUPIVACAINE-NACL 0.5-0.125-0.9 MG/250ML-% EP SOLN
12.0000 mL/h | EPIDURAL | Status: DC | PRN
  Filled 2019-07-09: qty 250

## 2019-07-09 MED ORDER — OXYCODONE HCL 5 MG PO TABS: 5.0000 mg | ORAL_TABLET | ORAL | Status: DC | PRN

## 2019-07-09 MED ORDER — ONDANSETRON HCL 4 MG PO TABS: 4.0000 mg | ORAL_TABLET | ORAL | Status: DC | PRN

## 2019-07-09 MED ORDER — SODIUM CHLORIDE (PF) 0.9 % IJ SOLN
INTRAMUSCULAR | Status: DC | PRN
  Administered 2019-07-09: 12 mL/h via EPIDURAL

## 2019-07-09 MED ORDER — DIPHENHYDRAMINE HCL 25 MG PO CAPS: 25.0000 mg | ORAL_CAPSULE | Freq: Four times a day (QID) | ORAL | Status: DC | PRN

## 2019-07-09 MED ORDER — OXYTOCIN BOLUS FROM INFUSION
500.0000 mL | Freq: Once | INTRAVENOUS | Status: AC
  Administered 2019-07-09: 500 mL via INTRAVENOUS

## 2019-07-09 MED ORDER — PRENATAL MULTIVITAMIN CH
1.0000 | ORAL_TABLET | Freq: Every day | ORAL | Status: DC
  Administered 2019-07-10: 1 via ORAL
  Filled 2019-07-09: qty 1

## 2019-07-09 MED ORDER — FAMOTIDINE 40 MG/5ML PO SUSR
20.0000 mg | Freq: Two times a day (BID) | ORAL | Status: DC
  Administered 2019-07-09: 20 mg via ORAL
  Filled 2019-07-09 (×2): qty 2.5

## 2019-07-09 MED ORDER — OXYTOCIN 40 UNITS IN NORMAL SALINE INFUSION - SIMPLE MED
2.5000 [IU]/h | INTRAVENOUS | Status: DC
  Filled 2019-07-09: qty 1000

## 2019-07-09 MED ORDER — WITCH HAZEL-GLYCERIN EX PADS: 1.0000 "application " | MEDICATED_PAD | CUTANEOUS | Status: DC | PRN

## 2019-07-09 MED ORDER — BENZOCAINE-MENTHOL 20-0.5 % EX AERO
1.0000 "application " | INHALATION_SPRAY | CUTANEOUS | Status: DC | PRN
  Administered 2019-07-09 – 2019-07-11 (×2): 1 via TOPICAL
  Filled 2019-07-09 (×2): qty 56

## 2019-07-09 MED ORDER — LACTATED RINGERS IV SOLN: 500.0000 mL | INTRAVENOUS | Status: DC | PRN

## 2019-07-09 NOTE — Anesthesia Procedure Notes (Signed)
Epidural Patient location during procedure: OB Start time: 07/09/2019 5:51 AM End time: 07/09/2019 5:54 AM  Staffing Anesthesiologist: Lyn Hollingshead, MD Performed: anesthesiologist   Preanesthetic Checklist Completed: patient identified, IV checked, site marked, risks and benefits discussed, surgical consent, monitors and equipment checked, pre-op evaluation and timeout performed  Epidural Patient position: sitting Prep: DuraPrep and site prepped and draped Patient monitoring: continuous pulse ox and blood pressure Approach: midline Location: L3-L4 Injection technique: LOR air  Needle:  Needle type: Tuohy  Needle gauge: 17 G Needle length: 9 cm and 9 Needle insertion depth: 5 cm cm Catheter type: closed end flexible Catheter size: 19 Gauge Catheter at skin depth: 10 cm Test dose: negative and Other  Assessment Events: blood not aspirated, injection not painful, no injection resistance, no paresthesia and negative IV test  Additional Notes Reason for block:procedure for pain

## 2019-07-09 NOTE — Progress Notes (Signed)
LABOR PROGRESS NOTE  Sally Crosby is a 29 y.o. G1P0 at [redacted]w[redacted]d  admitted for spontaneous labor and gHTN.  Subjective: Feeling comfortable w epidural  Objective: BP 133/86 (BP Location: Left Arm)   Pulse (!) 103   Temp 98.6 F (37 C) (Oral)   Resp 16   Ht 5\' 3"  (1.6 m)   Wt 74.9 kg   LMP 09/28/2018   BMI 29.25 kg/m  or  Vitals:   07/09/19 0831 07/09/19 0901 07/09/19 0931 07/09/19 1007  BP: 118/79 123/88 126/75 133/86  Pulse: (!) 116 (!) 132 (!) 110 (!) 103  Resp:  16    Temp:  98.6 F (37 C)    TempSrc:  Oral    Weight:      Height:         Dilation: 5.5 Effacement (%): 100 Cervical Position: Middle Station: 0 Presentation: Vertex Exam by:: Dr. Dione Plover  FHT: baseline rate 150, moderate varibility, +acel, -decel Toco: q2-3 min  Labs: Lab Results  Component Value Date   WBC 10.7 (H) 07/09/2019   HGB 12.0 07/09/2019   HCT 37.6 07/09/2019   MCV 83.9 07/09/2019   PLT 182 07/09/2019    Patient Active Problem List   Diagnosis Date Noted  . Post-dates pregnancy 07/09/2019  . H/O eczema 07/06/2019  . Uterine fibroid in pregnancy 02/17/2019  . Alpha thalassemia silent carrier 02/17/2019  . Supervision of low-risk pregnancy 01/06/2019  . History of herpes genitalis 01/06/2019    Assessment / Plan: 29 y.o. G1P0 at [redacted]w[redacted]d here for SOL and possible PIH.  Labor: s/p AROM for clear at 0900, has had adequate contraction pattern since then, period of expectant management and recheck in 4h, if unchanged will start pitocin augmentation 2x2. Fetal Wellbeing:  Cat I Pain Control:  epidural GBS: neg Anticipated MOD:  SVD  gHTN: patient with two mild range BP's exactly four hours apart though subsequently normotensive, labs unremarkable. Watch closely for s/sx of PreE.   Augustin Coupe, MD/[facility] OB Fellow  07/09/2019, 10:42 AM

## 2019-07-09 NOTE — Discharge Summary (Signed)
Postpartum Discharge Summary     Patient Name: Sally Crosby DOB: 02/14/1991 MRN: [id number]  Date of admission: 07/09/2019 Delivering Provider: Clarnce Flock   Date of discharge: 07/11/2019  Admitting diagnosis: Post-dates pregnancy [O48.0] Intrauterine pregnancy: [redacted]w[redacted]d    Secondary diagnosis:  Active Problems:   Supervision of low-risk pregnancy   History of herpes genitalis   Uterine fibroid in pregnancy   Alpha thalassemia silent carrier   Post-dates pregnancy   NSVD (normal spontaneous vaginal delivery)   Type 3a perineal laceration   Gestational hypertension  Additional problems: None     Discharge diagnosis: Term Pregnancy Delivered and Gestational Hypertension                                                                                                Post partum procedures:None  Augmentation: AROM  Complications: None  Hospital course:  Onset of Labor With Vaginal Delivery     29y.o. yo G1P0 at 454w4das admitted in Latent Labor on 07/09/2019. Patient had an uncomplicated labor course as follows: arrived at 4cm and augmented with AROM and progressed to complete. During the course of her labor had multiple mild range BP's and met criteria for gHTN. Membrane Rupture Time/Date: 9:01 AM ,07/09/2019   Intrapartum Procedures: Episiotomy: None [1]                                         Lacerations:  3rd degree [4]  Patient had a delivery of a Viable infant. 07/09/2019  Information for the patient's newborn:  FrPearley, [place][[id number]]Delivery Method: Vaginal, Spontaneous(Filed from Delivery Summary)     Pateint had an uncomplicated postpartum course. BP's monitored and WNL; BP check requested. She will have f/u in 2-3 weeks for third decree laceration. She is ambulating, tolerating a regular diet, passing flatus, and urinating well. Patient is discharged home in stable condition on 07/11/19.  Delivery time: 4:17 PM    Magnesium Sulfate received:  No BMZ received: No Rhophylac:N/A MMR:N/A Transfusion:No  Physical exam  Vitals:   07/10/19 1500 07/10/19 1940 07/10/19 2134 07/11/19 0524  BP: 121/86 109/68 115/72 121/72  Pulse: 74 80 86 79  Resp: _0 Temp: 98.9 F (37.2 C) 98.4 F (36.9 C) 98.3 F (36.8 C) 98.4 F (36.9 C)  TempSrc: Oral Oral Oral Oral  SpO2:  100%  100%  Weight:      Height:       General: alert, cooperative and no distress Lochia: appropriate Uterine Fundus: firm Incision: N/A DVT Evaluation: No evidence of DVT seen on physical exam. Labs: Lab Results  Component Value Date   WBC 10.7 (H) 07/09/2019   HGB 12.0 07/09/2019   HCT 37.6 07/09/2019   MCV 83.9 07/09/2019   PLT 182 07/09/2019   CMP Latest Ref Rng & Units 07/09/2019  Glucose 70 - 99 mg/dL 96  BUN 6 - 20 mg/dL 10  Creatinine 0.44 - 1.00 mg/dL 0.68  Sodium 135 -  145 mmol/L 135  Potassium 3.5 - 5.1 mmol/L 4.1  Chloride 98 - 111 mmol/L 103  CO2 22 - 32 mmol/L 18(L)  Calcium 8.9 - 10.3 mg/dL 9.2  Total Protein 6.5 - 8.1 g/dL 7.1  Total Bilirubin 0.3 - 1.2 mg/dL 0.6  Alkaline Phos 38 - 126 U/L 199(H)  AST 15 - 41 U/L 42(H)  ALT 0 - 44 U/L 21   Edinburgh Score: Edinburgh Postnatal Depression Scale Screening Tool 07/10/2019  I have been able to laugh and see the funny side of things. 0  I have looked forward with enjoyment to things. 0  I have blamed myself unnecessarily when things went wrong. 0  I have been anxious or worried for no good reason. 0  I have felt scared or panicky for no good reason. 0  Things have been getting on top of me. 0  I have been so unhappy that I have had difficulty sleeping. 0  I have felt sad or miserable. 0  I have been so unhappy that I have been crying. 0  The thought of harming myself has occurred to me. 0  Edinburgh Postnatal Depression Scale Total 0    Discharge instruction: per After Visit Summary and "Baby and Me Booklet".  After visit meds:  Allergies as of 07/11/2019   No Known  Allergies     Medication List    STOP taking these medications   valACYclovir 500 MG tablet Commonly known as: Valtrex     TAKE these medications   acetaminophen 325 MG tablet Commonly known as: Tylenol Take 2 tablets (650 mg total) by mouth every 4 (four) hours as needed (for pain scale < 4).   Blood Pressure Kit Devi 1 Device by Does not apply route daily. ICD 10: Z34.00   ibuprofen 600 MG tablet Commonly known as: ADVIL Take 1 tablet (600 mg total) by mouth every 6 (six) hours.   PRENATAL VITAMIN PO Take 1 tablet by mouth daily.   senna-docusate 8.6-50 MG tablet Commonly known as: Senokot-S Take 2 tablets by mouth daily. Start taking on: July 12, 2019       Diet: routine diet  Activity: Advance as tolerated. Pelvic rest for 6 weeks.   Outpatient follow up:6 weeks Follow up Appt: Future Appointments  Date Time Provider [place]  07/11/2019  1:15 PM [facility] NST [facility] WOC  07/11/2019  2:15 PM Stinson, Tanna Savoy, DO [facility] WOC   Follow up Visit:   Please schedule this patient for Postpartum visit in: 6 weeks with the following provider: Any provider In-Person For C/S patients schedule nurse incision check in weeks 2 weeks: no High risk pregnancy complicated by: gHTN Delivery mode:  SVD Anticipated Birth Control:  other/unsure PP Procedures needed: BP check, 2wk perineal check  Schedule Integrated BH visit: no   Newborn Data: Live born female  Birth Weight:  3510g APGAR: 35, 9  Newborn Delivery   Birth date/time: 07/09/2019 16:17:00 Delivery type: Vaginal, Spontaneous      Baby Feeding: Breast Disposition:home with mother   07/11/2019 Chauncey Mann, MD

## 2019-07-09 NOTE — MAU Note (Signed)
Pt reports to MAU c/o ctx every 7 min. Pt is having bloody show. +FM. No LOF.

## 2019-07-09 NOTE — Progress Notes (Signed)
LABOR PROGRESS NOTE  Sally Crosby is a 29 y.o. G1P0 at [redacted]w[redacted]d  admitted for spontaneous labor and gHTN.  Subjective: Strip review  Objective: BP 138/87 (BP Location: Left Arm)   Pulse (!) 119   Temp 98.3 F (36.8 C) (Oral)   Resp 16   Ht 5\' 3"  (1.6 m)   Wt 74.9 kg   LMP 09/28/2018   BMI 29.25 kg/m  or  Vitals:   07/09/19 1101 07/09/19 1131 07/09/19 1201 07/09/19 1231  BP: 132/78 132/81 (!) 137/95 138/87  Pulse: 91 91 (!) 106 (!) 119  Resp:    16  Temp:  98.3 F (36.8 C)    TempSrc:  Oral    Weight:      Height:         Dilation: 8.5 Effacement (%): 100 Cervical Position: Middle Station: Plus 1 Presentation: Vertex Exam by:: Gaymon Caldron RNC  FHT: baseline rate 140, moderate varibility, +acel, variable decel Toco: q2-3 min  Labs: Lab Results  Component Value Date   WBC 10.7 (H) 07/09/2019   HGB 12.0 07/09/2019   HCT 37.6 07/09/2019   MCV 83.9 07/09/2019   PLT 182 07/09/2019    Patient Active Problem List   Diagnosis Date Noted  . Post-dates pregnancy 07/09/2019  . H/O eczema 07/06/2019  . Uterine fibroid in pregnancy 02/17/2019  . Alpha thalassemia silent carrier 02/17/2019  . Supervision of low-risk pregnancy 01/06/2019  . History of herpes genitalis 01/06/2019    Assessment / Plan: 29 y.o. G1P0 at [redacted]w[redacted]d here for SOL and possible PIH.  Labor: s/p AROM for clear at 0900, progressed spontaneously to 8.5cm, cont expectant management Fetal Wellbeing:  Cat I Pain Control:  epidural GBS: neg Anticipated MOD:  SVD  gHTN: mild range BP's, labs unremarkable. Watch closely for s/sx of PreE.   Augustin Coupe, MD/[facility] OB Fellow  07/09/2019, 1:00 PM

## 2019-07-09 NOTE — H&P (Addendum)
OBSTETRIC ADMISSION HISTORY AND PHYSICAL  Sally Crosby is a 29 y.o. female G1P0 with IUP at 72w4dby LMP presenting for SOL. She reports +FMs, No LOF, no VB, no blurry vision, headaches or peripheral edema, and RUQ pain.  She plans on breast feeding. She plans to use condoms for birth control. [facility]  Dating: By LMP --->  Estimated Date of Delivery: 07/05/19  Sono:    @[redacted]w[redacted]d , CWD, normal anatomy, cephalic [id number],892J 38% EFW   Prenatal History/Complications: Hx of HSV Alpha-thal carrier Acid reflux  Past Medical History: History reviewed. No pertinent past medical history.  Past Surgical History: Past Surgical History:  Procedure Laterality Date  . HERNIA REPAIR  1999    Obstetrical History: OB History    Gravida  1   Para      Term      Preterm      AB      Living        SAB      TAB      Ectopic      Multiple      Live Births              Social History Social History   Socioeconomic History  . Marital status: Single    Spouse name: Not on file  . Number of children: Not on file  . Years of education: Not on file  . Highest education level: Not on file  Occupational History  . Not on file  Tobacco Use  . Smoking status: Never Smoker  . Smokeless tobacco: Never Used  Substance and Sexual Activity  . Alcohol use: Not Currently    Comment: occasional  . Drug use: Never  . Sexual activity: Yes    Birth control/protection: None  Other Topics Concern  . Not on file  Social History Narrative  . Not on file   Social Determinants of Health   Financial Resource Strain:   . Difficulty of Paying Living Expenses:   Food Insecurity: No Food Insecurity  . Worried About RCharity fundraiserin the Last Year: Never true  . Ran Out of Food in the Last Year: Never true  Transportation Needs: No Transportation Needs  . Lack of Transportation (Medical): No  . Lack of Transportation (Non-Medical): No   Physical Activity:   . Days of Exercise per Week:   . Minutes of Exercise per Session:   Stress:   . Feeling of Stress :   Social Connections:   . Frequency of Communication with Friends and Family:   . Frequency of Social Gatherings with Friends and Family:   . Attends Religious Services:   . Active Member of Clubs or Organizations:   . Attends CArchivistMeetings:   .Marland KitchenMarital Status:     Family History: Family History  Problem Relation Age of Onset  . Hypertension Mother     Allergies: No Known Allergies  Medications Prior to Admission  Medication Sig Dispense Refill Last Dose  . Prenatal Vit-Fe Fumarate-FA (PRENATAL VITAMIN PO) Take 1 tablet by mouth daily.   07/08/2019 at Unknown time  . valACYclovir (VALTREX) 500 MG tablet Take 1 tablet (500 mg total) by mouth 2 (two) times daily. 60 tablet 1 07/08/2019 at Unknown time  . Blood Pressure Monitoring (BLOOD PRESSURE KIT) DEVI 1 Device by Does not apply route daily. ICD 10: Z34.00 1 Device 0 Unknown at Unknown time     Review  of Systems   All systems reviewed and negative except as stated in HPI  Blood pressure 129/87, pulse (!) 103, temperature 98.5 F (36.9 C), temperature source Oral, resp. rate 18, height 5' 3"  (1.6 m), weight 74.9 kg, last menstrual period 09/28/2018. General appearance: alert, cooperative and moderate distress Lungs: clear to auscultation bilaterally Heart: regular rate and rhythm Abdomen: soft, non-tender; bowel sounds normal Pelvic: 5/100/-1. No visible lesions. Intact membranes Extremities: Homans sign is negative, no sign of DVT DTR's no clonus. Equal b/l.  Presentation: cephalic Fetal monitoringBaseline: 140 bpm, Variability: Good {> 6 bpm) and Accelerations: Reactive Uterine activityFrequency: Every 2-3 minutes and Intensity: strong Dilation: 5 Effacement (%): 100 Station: -1 Exam by:: J Mbugua RN   Prenatal labs: ABO, Rh: --/--/A POS (04/10 7371) Antibody: NEG (04/10  0336) Rubella: 2.71 (10/22 1125) RPR: Non Reactive (01/20 0915)  HBsAg: Negative (10/22 1125)  HIV: Non Reactive (01/20 0915)  GBS: Negative/-- (03/10 0851)  3 hr Glucola 77/122/133 Genetic screening  Low risk NIPS; AFP neg Anatomy US normal.   Prenatal Transfer Tool  Maternal Diabetes: No Genetic Screening: Normal Maternal Ultrasounds/Referrals: Normal Fetal Ultrasounds or other Referrals:  None Maternal Substance Abuse:  No Significant Maternal Medications:  None Significant Maternal Lab Results: Group B Strep negative  Results for orders placed or performed during the hospital encounter of 07/09/19 (from the past 24 hour(s))  Comprehensive metabolic panel   Collection Time: 07/09/19  3:36 AM  Result Value Ref Range   Sodium 135 135 - 145 mmol/L   Potassium 4.1 3.5 - 5.1 mmol/L   Chloride 103 98 - 111 mmol/L   CO2 18 (L) 22 - 32 mmol/L   Glucose, Bld 96 70 - 99 mg/dL   BUN 10 6 - 20 mg/dL   Creatinine, Ser 0.68 0.44 - 1.00 mg/dL   Calcium 9.2 8.9 - 10.3 mg/dL   Total Protein 7.1 6.5 - 8.1 g/dL   Albumin 3.1 (L) 3.5 - 5.0 g/dL   AST 42 (H) 15 - 41 U/L   ALT 21 0 - 44 U/L   Alkaline Phosphatase 199 (H) 38 - 126 U/L   Total Bilirubin 0.6 0.3 - 1.2 mg/dL   GFR calc non Af Amer >60 >60 mL/min   GFR calc Af Amer >60 >60 mL/min   Anion gap 14 5 - 15  CBC   Collection Time: 07/09/19  3:36 AM  Result Value Ref Range   WBC 10.7 (H) 4.0 - 10.5 K/uL   RBC 4.48 3.87 - 5.11 MIL/uL   Hemoglobin 12.0 12.0 - 15.0 g/dL   HCT 37.6 36.0 - 46.0 %   MCV 83.9 80.0 - 100.0 fL   MCH 26.8 26.0 - 34.0 pg   MCHC 31.9 30.0 - 36.0 g/dL   RDW 15.5 11.5 - 15.5 %   Platelets 182 150 - 400 K/uL   nRBC 0.0 0.0 - 0.2 %  Type and screen   Collection Time: 07/09/19  3:36 AM  Result Value Ref Range   ABO/RH(D) A POS    Antibody Screen NEG    Sample Expiration      07/12/2019,2359 Performed at [facility], [address] [place],  [address]     Patient Active Problem List    Diagnosis Date Noted  . Post-dates pregnancy 07/09/2019  . H/O eczema 07/06/2019  . Uterine fibroid in pregnancy 02/17/2019  . Alpha thalassemia silent carrier 02/17/2019  . Supervision of low-risk pregnancy 01/06/2019  . History of herpes  genitalis 01/06/2019    Assessment/Plan:  Sally Crosby is a 29 y.o. G1P0 at 82w4dhere for SOL. Pregnancy complicated by hx of HSV (on suppressive therapy). No hx of pre-E/gHTN but did have elevated BP in MAU.   #Labor:SOL.  Expectant mgmt.   #Pain: Desires epidural #FWB: Cat I #ID:  GBS neg; Hx of HSV - on valtrex.  #MOF: breast #MOC:condoms #Circ:  N/a #elevated BP: f/u cmp and PrCr ratio  DBenay Pike MD  07/09/2019, 5:01 AM   Attestation of Supervision of Student:  I confirm that I have verified the information documented in the resident's note and that I have also personally reperformed the history, physical exam and all medical decision making activities.  I have verified that all services and findings are accurately documented in this student's note; and I agree with management and plan as outlined in the documentation. I have also made any necessary editorial changes.  Patient expresses desire for epidural.  Gender unknown and reports desire for husband to announce.   General: No Distress Chest: Lungs-CTAB, Heart-RRR Abd: Gravid-Appears AGA, Soft, NT, Mild ctx palpated.  [place]: 6 1/2-3/4lbs Ext: No pitting edema  JMaryann Conners CMillsborofor WDe[employer] Company CBeltramiGroup 07/09/2019 5:53 AM

## 2019-07-09 NOTE — Anesthesia Preprocedure Evaluation (Signed)
Anesthesia Evaluation  Patient identified by MRN, date of birth, ID band Patient awake    Reviewed: Allergy & Precautions, H&P , NPO status , Patient's Chart, lab work & pertinent test results  Airway Mallampati: I  TM Distance: >3 FB Neck ROM: full    Dental no notable dental hx. (+) Teeth Intact   Pulmonary neg pulmonary ROS,    Pulmonary exam normal breath sounds clear to auscultation       Cardiovascular negative cardio ROS Normal cardiovascular exam Rhythm:regular Rate:Normal     Neuro/Psych negative neurological ROS  negative psych ROS   GI/Hepatic negative GI ROS, Neg liver ROS,   Endo/Other  negative endocrine ROS  Renal/GU negative Renal ROS  negative genitourinary   Musculoskeletal negative musculoskeletal ROS (+)   Abdominal Normal abdominal exam  (+)   Peds  Hematology negative hematology ROS (+)   Anesthesia Other Findings   Reproductive/Obstetrics (+) Pregnancy                             Anesthesia Physical Anesthesia Plan  ASA: II  Anesthesia Plan: Epidural   Post-op Pain Management:    Induction:   PONV Risk Score and Plan:   Airway Management Planned:   Additional Equipment:   Intra-op Plan:   Post-operative Plan:   Informed Consent: I have reviewed the patients History and Physical, chart, labs and discussed the procedure including the risks, benefits and alternatives for the proposed anesthesia with the patient or authorized representative who has indicated his/her understanding and acceptance.       Plan Discussed with:   Anesthesia Plan Comments:         Anesthesia Quick Evaluation

## 2019-07-10 NOTE — Lactation Note (Addendum)
This note was copied from a baby's chart. Lactation Consultation Note Mom asked for Lactation to come back in am. Mom wants to gets some rest before baby gets her bath. Encouraged mom to call if needed before am.   Patient Name: Sally Crosby [id number] Date: 07/10/2019     Maternal Data    Feeding Feeding Type: Breast Fed  LATCH Score                   Interventions    Lactation Tools Discussed/Used     Consult Status      Theodoro Kalata 07/10/2019, 1:51 AM

## 2019-07-10 NOTE — Lactation Note (Signed)
This note was copied from a baby's chart. Lactation Consultation Note Attempted to see mom but was sleeping.  Patient Name: Sally Crosby [id number] Date: 07/10/2019     Maternal Data    Feeding Feeding Type: Breast Fed  LATCH Score                   Interventions    Lactation Tools Discussed/Used     Consult Status      Theodoro Kalata 07/10/2019, 11:31 PM

## 2019-07-10 NOTE — Anesthesia Postprocedure Evaluation (Signed)
Anesthesia Post Note  Patient: Sally Crosby  Procedure(s) Performed: AN AD HOC LABOR EPIDURAL     Patient location during evaluation: Mother Baby Anesthesia Type: Epidural Level of consciousness: awake and alert Pain management: pain level controlled Vital Signs Assessment: post-procedure vital signs reviewed and stable Respiratory status: spontaneous breathing Cardiovascular status: stable Postop Assessment: no headache, epidural receding, patient able to bend at knees, able to ambulate and no apparent nausea or vomiting Anesthetic complications: no Comments: Pt reports discomfort at epidural site. Told to contact anesthesia if discomfort worsened but reassured that it should get better. No other complaints    Last Vitals:  Vitals:   07/10/19 0031 07/10/19 0500  BP: 123/81 115/79  Pulse: 70 68  Resp: 15 16  Temp: 37.1 C 37 C  SpO2: 100% 100%    Last Pain:  Vitals:   07/10/19 0500  TempSrc: Oral  PainSc: 0-No pain   Pain Goal: Patients Stated Pain Goal: 3 (07/09/19 0523)                 Everette Rank

## 2019-07-10 NOTE — Progress Notes (Signed)
Post Partum Day 1 Subjective: Patient reports feeling well. She is tolerating PO. Ambulating and urinating without difficulty. Lochia minimal.  Objective: Blood pressure 115/79, pulse 68, temperature 98.6 F (37 C), temperature source Oral, resp. rate 16, height 5\' 3"  (1.6 m), weight 74.9 kg, last menstrual period 09/28/2018, SpO2 100 %, unknown if currently breastfeeding.  Physical Exam:  General: alert, cooperative and appears stated age Lochia: appropriate Uterine Fundus: firm Incision: NA DVT Evaluation: No evidence of DVT seen on physical exam.  Recent Labs    07/09/19 0336  HGB 12.0  HCT 37.6    Assessment/Plan: Plan for discharge tomorrow  GHTN: BP's currently WNL Vitals stable Breastfeeding Condoms for birth control   LOS: 1 day   Chauncey Mann 07/10/2019, 7:59 AM

## 2019-07-10 NOTE — Lactation Note (Signed)
This note was copied from a baby's chart. Lactation Consultation Note  Patient Name: Sally Crosby Today's Date: 07/10/2019 Reason for consult: Initial assessment;Term;Primapara;1st time breastfeeding  P1 mother whose infant is now 48 hours old.  This is a term baby at 40+4 weeks.  Baby was swaddled and asleep in the bassinet when I arrived.  Mother stated that she has had some pain with latching.  Mother's breasts are soft and non tender and nipples are short shafted, everted and intact.  Discussed proper latching techniques.  Encouraged mother to call her RN/LC for the next feeding for a latch assist.  Coconut oil provided with instructions for use.  Mother applied coconut oil to both breasts for comfort.  Suggested she also use EBM as a natural lubricant.  Encouraged to feed 8-12 times/24 hours or sooner if baby shows feeding cues.  Reviewed cues.  Mother has been taught hand expression and can express a couple drops of colostrum.  Suggested she finger feed/spoon feed the drops back to baby.  Educated parents on breast feeding basics.    Mother is currently a [facility] participant in [place].  She does not have a DEBP.  Offered to fax a referral and mother appreciative.  Mother requested a manual pump.  Pump provided with instructions for use.  #24 flange size is appropriate at this time.    No brochure given due to unavailability.  Father present and very supportive.  Both parents receptive to learning.     Maternal Data Formula Feeding for Exclusion: No Has patient been taught Hand Expression?: Yes Does the patient have breastfeeding experience prior to this delivery?: No  Feeding    LATCH Score                   Interventions    Lactation Tools Discussed/Used WIC Program: Yes Pump Review: Setup, frequency, and cleaning;Milk Storage Initiated by:: Elynore Dolinski Date initiated:: 07/11/19   Consult Status Consult Status: Follow-up Date: 07/11/19 Follow-up  type: In-patient    Sally Crosby 07/10/2019, 2:45 PM

## 2019-07-11 ENCOUNTER — Encounter: Payer: Medicaid Other | Admitting: Family Medicine

## 2019-07-11 ENCOUNTER — Other Ambulatory Visit: Payer: Medicaid Other

## 2019-07-11 DIAGNOSIS — Z8759 Personal history of other complications of pregnancy, childbirth and the puerperium: Secondary | ICD-10-CM

## 2019-07-11 DIAGNOSIS — O139 Gestational [pregnancy-induced] hypertension without significant proteinuria, unspecified trimester: Secondary | ICD-10-CM

## 2019-07-11 MED ORDER — IBUPROFEN 600 MG PO TABS: 600.0000 mg | ORAL_TABLET | Freq: Four times a day (QID) | ORAL | 0 refills | Status: DC

## 2019-07-11 MED ORDER — ACETAMINOPHEN 325 MG PO TABS: 650.0000 mg | ORAL_TABLET | ORAL | 0 refills | Status: DC | PRN

## 2019-07-11 MED ORDER — SENNOSIDES-DOCUSATE SODIUM 8.6-50 MG PO TABS: 2.0000 | ORAL_TABLET | ORAL | 0 refills | Status: DC

## 2019-07-11 MED ORDER — POLYETHYLENE GLYCOL 3350 17 G PO PACK: 17.0000 g | PACK | Freq: Every day | ORAL | 0 refills | Status: DC

## 2019-07-11 NOTE — Lactation Note (Signed)
Lactation Consultation Note:  Mother was discharged early before 7-3 [place] saw her. Mother went home using a NS. I phoned mother at 11:45 to inform her of Out patient services and  advised her to protect her milk supply by pumping after feeding. Watching for milk in the shield and keeping account of infants weight  And I/Os. I left a detailed message and informed mother to phone back to [facility] office if she has any questions or concerns.       Patient Name: Sally Crosby [id number] Date: 07/11/2019     Maternal Data    Feeding    LATCH Score                   Interventions    Lactation Tools Discussed/Used     Consult Status      Darla Lesches 07/11/2019, 11:53 AM

## 2019-07-11 NOTE — Lactation Note (Signed)
This note was copied from a baby's chart. Lactation Consultation Note Baby 54 hrs old. Mom states that BF going much better. Mom states that the baby has been cluster feeding earlier so she gave her some formula because she felt like she was hungry, now the baby is spitting up. Mom has a NS used it during the day but hasn't used it during the night and has been doing fine. Mom stated she will take the NS home in case she does need it. Engorgement, milk storage, hand free bra, pumping, supply and demand discussed. Mom stated she has no questions or concerns. Mom has [place] and a DEBP at home. Gave mom Lactation brochure and mentioned support groups.  Encouraged mom to call RN for next latch before leaving.   Patient Name: Sally Crosby [id number] Date: 07/11/2019 Reason for consult: Follow-up assessment;Primapara;Term   Maternal Data    Feeding Feeding Type: Bottle Fed - Formula(Per request for MOB, declined donor milk.MOB low milk supply) Nipple Type: Slow - flow  LATCH Score                   Interventions Interventions: Breast feeding basics reviewed;Hand pump;Breast compression;Coconut oil  Lactation Tools Discussed/Used Tools: Coconut oil   Consult Status Consult Status: Complete Date: 07/11/19    Theodoro Kalata 07/11/2019, 4:46 AM

## 2019-07-12 ENCOUNTER — Inpatient Hospital Stay (HOSPITAL_COMMUNITY): Payer: Medicaid Other

## 2019-07-12 ENCOUNTER — Inpatient Hospital Stay (HOSPITAL_COMMUNITY): Admission: AD | Admit: 2019-07-12 | Payer: Medicaid Other | Source: Home / Self Care | Admitting: Family Medicine

## 2019-07-19 ENCOUNTER — Encounter: Payer: Self-pay | Admitting: *Deleted

## 2019-07-19 ENCOUNTER — Telehealth (INDEPENDENT_AMBULATORY_CARE_PROVIDER_SITE_OTHER): Payer: Commercial Managed Care - PPO | Admitting: *Deleted

## 2019-07-19 ENCOUNTER — Other Ambulatory Visit: Payer: Self-pay

## 2019-07-19 DIAGNOSIS — Z0130 Encounter for examination of blood pressure without abnormal findings: Secondary | ICD-10-CM

## 2019-07-19 NOTE — Progress Notes (Addendum)
Pt was here for scheduled blood pressure check visit however prior to the visit being initiated, she stated she needed to leave. I called pt in order to conduct telephone visit and have her to check her BP and she did not answer. Her VM was full so I was unable to leave a message. I sent a MyChart message to pt and asked her to check her BP and report the results via MyChart message. Pt's completed PHQ-9 and GAD-7 questionnaire was not viewed until after she had left the office. Pt has next scheduled visit in office on 5/3.

## 2019-08-01 ENCOUNTER — Ambulatory Visit: Payer: Commercial Managed Care - PPO | Admitting: Obstetrics & Gynecology

## 2019-08-10 ENCOUNTER — Encounter: Payer: Self-pay | Admitting: General Practice

## 2019-08-16 ENCOUNTER — Other Ambulatory Visit: Payer: Self-pay

## 2019-08-16 ENCOUNTER — Ambulatory Visit: Payer: Commercial Managed Care - PPO | Admitting: Women's Health

## 2019-08-16 ENCOUNTER — Encounter: Payer: Self-pay | Admitting: Student

## 2019-08-16 ENCOUNTER — Ambulatory Visit (INDEPENDENT_AMBULATORY_CARE_PROVIDER_SITE_OTHER): Payer: Commercial Managed Care - PPO | Admitting: Student

## 2019-08-16 VITALS — BP 123/74 | HR 54 | Wt 147.0 lb

## 2019-08-16 DIAGNOSIS — F53 Postpartum depression: Secondary | ICD-10-CM

## 2019-08-16 DIAGNOSIS — Z1332 Encounter for screening for maternal depression: Secondary | ICD-10-CM | POA: Diagnosis not present

## 2019-08-16 MED ORDER — POLYETHYLENE GLYCOL 3350 17 G PO PACK: 17.0000 g | PACK | Freq: Every day | ORAL | 0 refills | Status: DC

## 2019-08-16 MED ORDER — SENNOSIDES-DOCUSATE SODIUM 8.6-50 MG PO TABS: 2.0000 | ORAL_TABLET | ORAL | 0 refills | Status: DC

## 2019-08-16 NOTE — Progress Notes (Signed)
Post Partum Visit Note  Sally Crosby is a 29 y.o. G79P1001 female who presents for a postpartum visit. She is 4 week postpartum following a NSVD.  I have fully reviewed the prenatal and intrapartum course. The delivery was at 40w 4d  gestational weeks.  Anesthesia: epidural. Postpartum course has been unremarkable . Baby is doing well. Baby is feeding by breast. Bleeding no bleeding. Bowel function is abnormal: constipation. Bladder function is normal. Patient is not sexually active. Contraception method is none. Postpartum depression screening:Postive    The following portions of the patient's history were reviewed and updated as appropriate: allergies, current medications, past family history, past medical history, past social history, past surgical history and problem list.  Patient is very tearful during visit; she has high anxiety about leaving her infant with her fiance. She reports only sleeping 3 hours at time and doing all the infant care day and night. She does not want physical exam today. She endorses some pain with BM and constipation; she had a 3rd degree tear which was repaired.  Review of Systems Pertinent items are noted in HPI.    Objective:  Blood pressure 123/74, pulse (!) 54, weight 147 lb (66.7 kg), last menstrual period 09/28/2018, unknown if currently breastfeeding.  General:  alert and cooperative   Breasts:  Deferred  Lungs: Deferred per patient  Heart:  Deferred per patient  Abdomen: Deferred per patient   Vulva:  deferred  Vagina: not evaluated  Cervix:  deferred  Corpus: not examined  Adnexa:  not evaluated  Rectal Exam: Not performed.        Assessment:    Healthy postpartum exam. Pap smear not done at today's visit.   Plan:   Essential components of care per ACOG recommendations:  1.  Mood and well being: Patient with positive depression screening today. Reviewed local resources for support. Patient will see [facility].  - Patient does not  use tobacco.  - hx of drug use? No   Agrees to see Roselyn Reef in one week  2. Infant care and feeding:  -Patient currently breastmilk feeding? Yes If breastmilk feeding discussed return to work and pumping. If needed, patient was provided letter for work to allow for every 2-3 hr pumping breaks, and to be granted a private location to express breastmilk and refrigerated area to store breastmilk. Reviewed importance of draining breast regularly to support lactation. -Social determinants of health (SDOH) reviewed in EPIC.   3. Sexuality, contraception and birth spacing - Patient does not want a pregnancy in the next year.  Desired family size is unknown children.  - Reviewed forms of contraception in tiered fashion. Patient desired unsure today.   - Discussed birth spacing of 18 months  4. Sleep and fatigue -Encouraged family/partner/community support of 4 hrs of uninterrupted sleep to help with mood and fatigue -Encouraged patient to allow fiance to take care of infant in the evening so that she may sleep for 4 hours of uninterrupted sleep  5. Physical Recovery  - Discussed patients delivery - Patient had a 3a degree laceration, perineal healing reviewed. Patient expressed understanding.  - Patient has urinary incontinence? No  - Patient is safe to resume physical and sexual activity -Will come back in one month for physical exam, pap smear -given RX for miralax and sennacote as she says this has helped her in the past  6.  Health Maintenance - Last pap smear done November 2018 and was normal with negative HPV. -will come  back in one month for pap smear   Starr Lake, [place] for [employer], [place]

## 2019-08-16 NOTE — Patient Instructions (Signed)

## 2019-08-17 NOTE — BH Assessment (Addendum)
error 

## 2019-08-19 ENCOUNTER — Other Ambulatory Visit: Payer: Self-pay

## 2019-08-19 ENCOUNTER — Ambulatory Visit (INDEPENDENT_AMBULATORY_CARE_PROVIDER_SITE_OTHER): Payer: Commercial Managed Care - PPO | Admitting: Clinical

## 2019-08-19 DIAGNOSIS — F4322 Adjustment disorder with anxiety: Secondary | ICD-10-CM

## 2019-08-19 NOTE — Patient Instructions (Signed)

## 2019-08-19 NOTE — BH Specialist Note (Signed)
Integrated Behavioral Health via Telemedicine Video Visit  08/17/2019 Sally Crosby [id number]  Number of [place] visits: 1 Session Start time: 8:15  Session End time: 8:58 Total time: 32  Referring Provider: Maye Hides, CNM Type of Visit: Video Patient/Family location: Home [facility] Provider location: [facility] [place] [facility]  All persons participating in visit: Patient Sally Crosby and [place]    Confirmed patient's address: Yes  Confirmed patient's phone number: Yes  Any changes to demographics: No   Confirmed patient's insurance: Yes  Any changes to patient's insurance: No   Discussed confidentiality: Yes    I connected with Linton Flemings  by a video enabled telemedicine application and verified that I am speaking with the correct person using two identifiers.     I discussed the limitations of evaluation and management by telemedicine and the availability of in person appointments.  I discussed that the purpose of this visit is to provide behavioral health care while limiting exposure to the novel coronavirus.   Discussed there is a possibility of technology failure and discussed alternative modes of communication if that failure occurs.  I discussed that engaging in this video visit, they consent to the provision of behavioral healthcare and the services will be billed under their insurance.  Patient and/or legal guardian expressed understanding and consented to video visit: Yes   PRESENTING CONCERNS: Patient and/or family reports the following symptoms/concerns: Pt states her primary symptoms are fatigue, anxiety, difficulty relaxing, attributed to lack of quality sleep and having a difficult time allowing others to watch baby. Pt agrees that  getting back to practicing yoga and improving sleep will help her to feel less anxious; open to learning  self-coping strategies today.  Duration of  problem: Postpartum; Severity of problem: moderate  STRENGTHS (Protective Factors/Coping Skills): Supportive fiance and mother; can identify self-coping strategy  GOALS ADDRESSED: Patient will: 1.  Reduce symptoms of: anxiety  2.  Increase knowledge and/or ability of: healthy habits and self-management skills  3.  Demonstrate ability to: Increase healthy adjustment to current life circumstances  INTERVENTIONS: Interventions utilized:  Mindfulness or Psychologist, educational and Psychoeducation and/or Health Education Standardized Assessments completed: GAD-7 and PHQ 9  ASSESSMENT: Patient currently experiencing Adjustment disorder with anxiety.   Patient may benefit from psychoeducation and brief therapeutic interventions regarding coping with symptoms of anxiety .  PLAN: 1. Follow up with behavioral health clinician on : Three weeks 2. Behavioral recommendations:  -CALM relaxation breathing twice daily (morning; evening) -Continue with bedtime sleep sounds -Consider apps (on After Visit Summary) for additional self-care -Continue with plan to start back practicing yoga -Consider taking brief 10-20 minute nap immediately after morning coffee  daily 3. Referral(s): [place] (In Clinic)  I discussed the assessment and treatment plan with the patient and/or parent/guardian. They were provided an opportunity to ask questions and all were answered. They agreed with the plan and demonstrated an understanding of the instructions.   They were advised to call back or seek an in-person evaluation if the symptoms worsen or if the condition fails to improve as anticipated.  Caroleen Hamman [facility]  Depression screen [facility] 2/9 08/19/2019 07/19/2019 05/25/2019 05/05/2019 04/20/2019  Decreased Interest 0 0 0 0 0  Down, Depressed, Hopeless 1 1 0 0 0  PHQ - 2 Score 1 1 0 0 0  Altered sleeping 1 1 0 0 0  Tired, decreased energy 3 1 0 3 1  Change in appetite  0 1 0 0 0  Feeling bad  or failure about yourself  1 1 0 0 0  Trouble concentrating 0 1 0 0 1  Moving slowly or fidgety/restless 0 0 0 0 0  Suicidal thoughts 0 0 0 0 0  PHQ-9 Score 6 6 0 3 2  Difficult doing work/chores - - Not difficult at all - -   GAD 7 : Generalized Anxiety Score 08/19/2019 07/19/2019 05/25/2019 05/05/2019  Nervous, Anxious, on Edge 3 3 0 0  Control/stop worrying 1 3 0 0  Worry too much - different things 1 3 1  0  Trouble relaxing 3 2 0 0  Restless 1 2 0 1  Easily annoyed or irritable 1 0 0 0  Afraid - awful might happen 1 3 0 0  Total GAD 7 Score 11 16 1  1

## 2019-08-26 ENCOUNTER — Telehealth: Payer: Commercial Managed Care - PPO | Admitting: Nurse Practitioner

## 2019-08-26 DIAGNOSIS — N939 Abnormal uterine and vaginal bleeding, unspecified: Secondary | ICD-10-CM

## 2019-08-26 NOTE — Progress Notes (Signed)
Based on what you shared with me, I feel your condition warrants further evaluation and I recommend that you be seen for a face to face visit. I recommend contacting your OB/GYN for an office appointment based on your symptoms.  Many offices offer virtual options to be seen via video if you are not comfortable going in person to a medical facility at this time.  If you do not have a PCP, [facility] offers a free physician referral service available at [phone]. Our trained staff has the experience, knowledge and resources to put you in touch with a physician who is right for you.   You also have the option of a video visit through [email or web address]  If you are having a true medical emergency please call 911.  NOTE: If you entered your credit card information for this eVisit, you will not be charged. You may see a "hold" on your card for the $35 but that hold will drop off and you will not have a charge processed.  Your e-visit answers were reviewed by a board certified advanced clinical practitioner to complete your personal care plan.  Thank you for using e-Visits.  I have spent at least 5 minutes reviewing and documenting in the patient's chart.

## 2019-08-30 NOTE — BH Specialist Note (Signed)
Pt is unable to do visit today with fussy baby; agrees to call back to reschedule as needed; MyChart message sent to pt.   Integrated Behavioral Health via Telemedicine Video Visit  08/30/2019 Sally Crosby [id number]   [name]  Depression screen [facility] 2/9 08/19/2019 07/19/2019 05/25/2019 05/05/2019 04/20/2019  Decreased Interest 0 0 0 0 0  Down, Depressed, Hopeless 1 1 0 0 0  PHQ - 2 Score 1 1 0 0 0  Altered sleeping 1 1 0 0 0  Tired, decreased energy 3 1 0 3 1  Change in appetite 0 1 0 0 0  Feeling bad or failure about yourself  1 1 0 0 0  Trouble concentrating 0 1 0 0 1  Moving slowly or fidgety/restless 0 0 0 0 0  Suicidal thoughts 0 0 0 0 0  PHQ-9 Score 6 6 0 3 2  Difficult doing work/chores - - Not difficult at all - -   GAD 7 : Generalized Anxiety Score 08/19/2019 07/19/2019 05/25/2019 05/05/2019  Nervous, Anxious, on Edge 3 3 0 0  Control/stop worrying 1 3 0 0  Worry too much - different things 1 3 1  0  Trouble relaxing 3 2 0 0  Restless 1 2 0 1  Easily annoyed or irritable 1 0 0 0  Afraid - awful might happen 1 3 0 0  Total GAD 7 Score 11 16 1  1

## 2019-08-31 ENCOUNTER — Telehealth: Payer: Self-pay | Admitting: Student

## 2019-08-31 NOTE — Telephone Encounter (Signed)
Attempted to reach the patient to get her scheduled in July for her annual exam.

## 2019-09-02 ENCOUNTER — Other Ambulatory Visit: Payer: Self-pay | Admitting: Student

## 2019-09-09 ENCOUNTER — Ambulatory Visit: Payer: Commercial Managed Care - PPO | Admitting: Clinical

## 2019-09-09 ENCOUNTER — Other Ambulatory Visit: Payer: Self-pay

## 2019-09-13 ENCOUNTER — Ambulatory Visit: Payer: Commercial Managed Care - PPO | Admitting: Student

## 2019-09-28 ENCOUNTER — Telehealth: Payer: Self-pay | Admitting: Family Medicine

## 2019-09-28 NOTE — Telephone Encounter (Signed)
Received a call from the patient stating she needed to come in to be checked out because she missed her postpartum visit. She stated she was feeling better, so she just didn't come to her appointment. Now she is having issues and would like a nurse to call her back. She stated she may not be able to answer the phone. I informed her she could receive a message through her MyChart. She agreed that would probably be better.

## 2019-09-29 NOTE — Telephone Encounter (Signed)
L/M for patient that I am returning her call.  If she continues to have concerns to please send [place] a message via [place].   Mel Almond, RN 09/29/19

## 2019-10-25 ENCOUNTER — Other Ambulatory Visit (HOSPITAL_COMMUNITY)
Admission: RE | Admit: 2019-10-25 | Discharge: 2019-10-25 | Disposition: A | Discharge status: Home / Self Care | Payer: Commercial Managed Care - PPO | Source: Ambulatory Visit | Attending: Student | Admitting: Student

## 2019-10-25 ENCOUNTER — Other Ambulatory Visit: Payer: Self-pay

## 2019-10-25 ENCOUNTER — Encounter: Payer: Self-pay | Admitting: Student

## 2019-10-25 ENCOUNTER — Ambulatory Visit (INDEPENDENT_AMBULATORY_CARE_PROVIDER_SITE_OTHER): Payer: Commercial Managed Care - PPO | Admitting: Student

## 2019-10-25 VITALS — BP 123/74 | HR 91 | Ht 63.0 in | Wt 151.1 lb

## 2019-10-25 DIAGNOSIS — Z01419 Encounter for gynecological examination (general) (routine) without abnormal findings: Secondary | ICD-10-CM | POA: Diagnosis present

## 2019-10-25 NOTE — Progress Notes (Signed)
  History:  [name] is a 29 y.o. G1P1001 who presents to clinic today for routine pap smear. SHe denies any complications but feels some discomfort during intercourse. She is feeling much better since her PP visit.   The following portions of the patient's history were reviewed and updated as appropriate: allergies, current medications, family history, past medical history, social history, past surgical history and problem list.  Review of Systems:  Review of Systems  Constitutional: Negative.   HENT: Negative.   Respiratory: Negative.   Cardiovascular: Negative.   Genitourinary: Negative.        Pain on skin with intercourse  Musculoskeletal: Negative.   Skin: Negative.   Neurological: Negative.   Psychiatric/Behavioral: Negative.       Objective:  Physical Exam BP 123/74   Pulse 91   Ht 5\' 3"  (1.6 m)   Wt 151 lb 1.6 oz (68.5 kg)   LMP 09/28/2018 (Approximate)   BMI 26.77 kg/m  Physical Exam Exam conducted with a chaperone present.  Constitutional:      Appearance: Normal appearance.  HENT:     Mouth/Throat:     Mouth: Mucous membranes are moist.  Cardiovascular:     Rate and Rhythm: Normal rate.  Pulmonary:     Effort: Pulmonary effort is normal.  Abdominal:     General: Abdomen is flat.  Genitourinary:    Comments: Small area of granulation tissue at introitus not healed, otherwise laceration is healed.  Musculoskeletal:        General: Normal range of motion.  Neurological:     Mental Status: She is alert.       Labs and Imaging No results found for this or any previous visit (from the past 24 hour(s)).  No results found.   Assessment & Plan:  1. Gynecologic exam normal -Patient reassured that healing is a long process, return if not better in 3 months -Return to clinic as needed - Cytology - PAP( [place])    Approximately 20 minutes of total time was spent with this patient on counseling and exam.   Starr Lake,  [place] 10/25/2019 11:55 AM

## 2019-10-26 LAB — CYTOLOGY - PAP: Diagnosis: NEGATIVE

## 2020-03-31 NOTE — L&D Delivery Note (Addendum)
LABOR COURSE Sally Crosby is a 30 y.o. female G2P1001 with IUP at [redacted]w[redacted]d by LMP who presented for SOL. She was found to be in active labor on admission with a bulging bag. After an epidural was placed and the patient was comfortable, we performed AROM with clear fluid return and the patient was complete. She labored down for about an hour and then was ready to push.  Delivery Note Called to room and patient was complete and pushing. Head delivered spontaneously. No nuchal cord present. Shoulder and body delivered in the usual fashion. At 0334 a viable and healthy female was delivered via Vaginal, Spontaneous (Presentation: vertex; LOA).  Infant with spontaneous cry, placed on mother's abdomen, dried and stimulated. Cord clamped x 2 after 1-minute delay, and cut by FOB. Cord blood drawn. Placenta delivered spontaneously with gentle cord traction. Appears intact. Fundus firm with massage and Pitocin. Labia, perineum, vagina, and cervix inspected with no lacerations requiring repair.    APGAR: 9, 9; weight pending.   Cord: 3VC with the following complications:none.   Cord pH: n/a  Anesthesia: None Episiotomy: None Lacerations: None   Suture Repair:  n/a Est. Blood Loss (mL): 75  Mom to postpartum.  Baby to Couplet care / Skin to Skin.  Araceli Bouche, MD 02/08/21 4:02 AM    The above was performed under my direct supervision and guidance.

## 2020-06-08 ENCOUNTER — Ambulatory Visit (INDEPENDENT_AMBULATORY_CARE_PROVIDER_SITE_OTHER): Payer: Medicaid Other | Admitting: *Deleted

## 2020-06-08 ENCOUNTER — Other Ambulatory Visit: Payer: Self-pay

## 2020-06-08 DIAGNOSIS — Z3200 Encounter for pregnancy test, result unknown: Secondary | ICD-10-CM

## 2020-06-08 LAB — POCT PREGNANCY, URINE: Preg Test, Ur: POSITIVE — AB

## 2020-06-08 NOTE — Progress Notes (Signed)
Pt submitted urine specimen for pregnancy test earlier today. I called and informed her of +UPT. She stated LMP 05/06/20 which yields EDD 02/10/21, now [redacted]w[redacted]d. Pt denies vaginal bleeding or abdominal pain. Pt was instructed to go to MAU if these sx should occur. I advised that she will be scheduled for prenatal care to begin @ approximately [redacted] wks EGA. She voiced understanding of all information and instructions given.

## 2020-06-11 NOTE — Progress Notes (Signed)
Patient was assessed and managed by nursing staff during this encounter. I have reviewed the chart and agree with the documentation and plan. I have also made any necessary editorial changes.  Aletha Halim, MD 06/11/2020 1:39 AM

## 2020-07-20 ENCOUNTER — Other Ambulatory Visit: Payer: Self-pay

## 2020-07-20 ENCOUNTER — Other Ambulatory Visit (HOSPITAL_COMMUNITY)
Admission: RE | Admit: 2020-07-20 | Discharge: 2020-07-20 | Disposition: A | Discharge status: Home / Self Care | Payer: Medicaid Other | Source: Ambulatory Visit | Attending: Obstetrics and Gynecology | Admitting: Obstetrics and Gynecology

## 2020-07-20 ENCOUNTER — Encounter: Payer: Self-pay | Admitting: Obstetrics and Gynecology

## 2020-07-20 ENCOUNTER — Ambulatory Visit (INDEPENDENT_AMBULATORY_CARE_PROVIDER_SITE_OTHER): Payer: Medicaid Other | Admitting: Obstetrics and Gynecology

## 2020-07-20 VITALS — BP 123/70 | HR 76 | Wt 143.5 lb

## 2020-07-20 DIAGNOSIS — Z3491 Encounter for supervision of normal pregnancy, unspecified, first trimester: Secondary | ICD-10-CM

## 2020-07-20 DIAGNOSIS — Z8759 Personal history of other complications of pregnancy, childbirth and the puerperium: Secondary | ICD-10-CM

## 2020-07-20 DIAGNOSIS — O99891 Other specified diseases and conditions complicating pregnancy: Secondary | ICD-10-CM | POA: Insufficient documentation

## 2020-07-20 DIAGNOSIS — Z8619 Personal history of other infectious and parasitic diseases: Secondary | ICD-10-CM | POA: Diagnosis not present

## 2020-07-20 DIAGNOSIS — O09892 Supervision of other high risk pregnancies, second trimester: Secondary | ICD-10-CM | POA: Insufficient documentation

## 2020-07-20 DIAGNOSIS — D563 Thalassemia minor: Secondary | ICD-10-CM

## 2020-07-20 DIAGNOSIS — F988 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 adolescence: Secondary | ICD-10-CM

## 2020-07-20 DIAGNOSIS — Z3482 Encounter for supervision of other normal pregnancy, second trimester: Secondary | ICD-10-CM | POA: Insufficient documentation

## 2020-07-20 DIAGNOSIS — O09891 Supervision of other high risk pregnancies, first trimester: Secondary | ICD-10-CM

## 2020-07-20 DIAGNOSIS — Z3483 Encounter for supervision of other normal pregnancy, third trimester: Secondary | ICD-10-CM | POA: Insufficient documentation

## 2020-07-20 DIAGNOSIS — D219 Benign neoplasm of connective and other soft tissue, unspecified: Secondary | ICD-10-CM

## 2020-07-20 DIAGNOSIS — Z3A10 10 weeks gestation of pregnancy: Secondary | ICD-10-CM | POA: Diagnosis not present

## 2020-07-20 DIAGNOSIS — F419 Anxiety disorder, unspecified: Secondary | ICD-10-CM | POA: Insufficient documentation

## 2020-07-20 DIAGNOSIS — Z8659 Personal history of other mental and behavioral disorders: Secondary | ICD-10-CM

## 2020-07-20 DIAGNOSIS — I10 Essential (primary) hypertension: Secondary | ICD-10-CM | POA: Insufficient documentation

## 2020-07-20 HISTORY — DX: 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 adolescence: F98.8

## 2020-07-20 LAB — POCT URINALYSIS DIP (DEVICE)
Bilirubin Urine: NEGATIVE
Glucose, UA: NEGATIVE mg/dL
Ketones, ur: NEGATIVE mg/dL
Leukocytes,Ua: NEGATIVE
Nitrite: NEGATIVE
Protein, ur: NEGATIVE mg/dL
Specific Gravity, Urine: 1.025 (ref 1.005–1.030)
Urobilinogen, UA: 0.2 mg/dL (ref 0.0–1.0)
pH: 7 (ref 5.0–8.0)

## 2020-07-20 NOTE — Addendum Note (Signed)
Addended by: Louisa Second E on: 07/20/2020 12:13 PM   Modules accepted: Orders

## 2020-07-20 NOTE — Patient Instructions (Signed)
[email or web address]">  First Trimester of Pregnancy  The first trimester of pregnancy starts on the first day of your last menstrual period until the end of week 12. This is also called months 1 through 3 of pregnancy. Body changes during your first trimester Your body goes through many changes during pregnancy. The changes usually return to normal after your baby is born. Physical changes  You may gain or lose weight.  Your breasts may grow larger and hurt. The area around your nipples may get darker.  Dark spots or blotches may develop on your face.  You may have changes in your hair. Health changes  You may feel like you might vomit (nauseous), and you may vomit.  You may have heartburn.  You may have headaches.  You may have trouble pooping (constipation).  Your gums may bleed. Other changes  You may get tired easily.  You may pee (urinate) more often.  Your menstrual periods will stop.  You may not feel hungry.  You may want to eat certain kinds of food.  You may have changes in your emotions from day to day.  You may have more dreams. Follow these instructions at home: Medicines  Take over-the-counter and prescription medicines only as told by your doctor. Some medicines are not safe during pregnancy.  Take a prenatal vitamin that contains at least 600 micrograms (mcg) of folic acid. Eating and drinking  Eat healthy meals that include: ? Fresh fruits and vegetables. ? Whole grains. ? Good sources of protein, such as meat, eggs, or tofu. ? Low-fat dairy products.  Avoid raw meat and unpasteurized juice, milk, and cheese.  If you feel like you may vomit, or you vomit: ? Eat 4 or 5 small meals a day instead of 3 large meals. ? Try eating a few soda crackers. ? Drink liquids between meals instead of during meals.  You may need to take these actions to prevent or treat trouble pooping: ? Drink enough fluids to keep your pee  (urine) pale yellow. ? Eat foods that are high in fiber. These include beans, whole grains, and fresh fruits and vegetables. ? Limit foods that are high in fat and sugar. These include fried or sweet foods. Activity  Exercise only as told by your doctor. Most people can do their usual exercise routine during pregnancy.  Stop exercising if you have cramps or pain in your lower belly (abdomen) or low back.  Do not exercise if it is too hot or too humid, or if you are in a place of great height (high altitude).  Avoid heavy lifting.  If you choose to, you may have sex unless your doctor tells you not to. Relieving pain and discomfort  Wear a good support bra if your breasts are sore.  Rest with your legs raised (elevated) if you have leg cramps or low back pain.  If you have bulging veins (varicose veins) in your legs: ? Wear support hose as told by your doctor. ? Raise your feet for 15 minutes, 3-4 times a day. ? Limit salt in your food. Safety  Wear your seat belt at all times when you are in a car.  Talk with your doctor if someone is hurting you or yelling at you.  Talk with your doctor if you are feeling sad or have thoughts of hurting yourself. Lifestyle  Do not use hot tubs, steam rooms, or saunas.  Do not douche. Do not use tampons or scented sanitary pads.  Do not  use herbal medicines, illegal drugs, or medicines that are not approved by your doctor. Do not drink alcohol.  Do not smoke or use any products that contain nicotine or tobacco. If you need help quitting, ask your doctor.  Avoid cat litter boxes and soil that is used by cats. These carry germs that can cause harm to the baby and can cause a loss of your baby by miscarriage or stillbirth. General instructions  Keep all follow-up visits. This is important.  Ask for help if you need counseling or if you need help with nutrition. Your doctor can give you advice or tell you where to go for help.  Visit your  dentist. At home, brush your teeth with a soft toothbrush. Floss gently.  Write down your questions. Take them to your prenatal visits. Where to find more information  American Pregnancy Association: americanpregnancy.org  [employer]: www.acog.org  Office on Women's Health: KeywordPortfolios.com.br Contact a doctor if:  You are dizzy.  You have a fever.  You have mild cramps or pressure in your lower belly.  You have a nagging pain in your belly area.  You continue to feel like you may vomit, you vomit, or you have watery poop (diarrhea) for 24 hours or longer.  You have a bad-smelling fluid coming from your vagina.  You have pain when you pee.  You are exposed to a disease that spreads from person to person, such as chickenpox, measles, Zika virus, HIV, or hepatitis. Get help right away if:  You have spotting or bleeding from your vagina.  You have very bad belly cramping or pain.  You have shortness of breath or chest pain.  You have any kind of injury, such as from a fall or a car crash.  You have new or increased pain, swelling, or redness in an arm or leg. Summary  The first trimester of pregnancy starts on the first day of your last menstrual period until the end of week 12 (months 1 through 3).  Eat 4 or 5 small meals a day instead of 3 large meals.  Do not smoke or use any products that contain nicotine or tobacco. If you need help quitting, ask your doctor.  Keep all follow-up visits. This information is not intended to replace advice given to you by your health care provider. Make sure you discuss any questions you have with your health care provider. Document Revised: 08/24/2019 Document Reviewed: 06/30/2019 Elsevier Patient Education  2021 [employer].

## 2020-07-20 NOTE — Progress Notes (Signed)
INITIAL PRENATAL VISIT NOTE  Subjective:  Sally Crosby is a 30 y.o. G2P1001 at 18w5dby LMP being seen today for her initial prenatal visit. She has an obstetric history significant for third degree tear and mild hypertension after delivery. She has a medical history significant for nothing.  Patient reports no complaints.  Contractions: Not present. Vag. Bleeding: None.  Movement: Absent. Denies leaking of fluid.    No past medical history on file.  Past Surgical History:  Procedure Laterality Date  . HERNIA REPAIR  1999    OB History  Gravida Para Term Preterm AB Living  2 1 1     1   SAB IAB Ectopic Multiple Live Births        0 1    # Outcome Date GA Lbr Len/2nd Weight Sex Delivery Anes PTL Lv  2 Current           1 Term 07/09/19 448w4d 00:52 7 lb 11.8 oz (3.51 kg) F Vag-Spont EPI  LIV    Social History   Socioeconomic History  . Marital status: Single    Spouse name: Not on file  . Number of children: Not on file  . Years of education: Not on file  . Highest education level: Not on file  Occupational History  . Not on file  Tobacco Use  . Smoking status: Never Smoker  . Smokeless tobacco: Never Used  Vaping Use  . Vaping Use: Never used  Substance and Sexual Activity  . Alcohol use: Not Currently    Comment: occasional  . Drug use: Never  . Sexual activity: Yes    Birth control/protection: None  Other Topics Concern  . Not on file  Social History Narrative  . Not on file   Social Determinants of Health   Financial Resource Strain: Not on file  Food Insecurity: No Food Insecurity  . Worried About RuCharity fundraisern the Last Year: Never true  . Ran Out of Food in the Last Year: Never true  Transportation Needs: No Transportation Needs  . Lack of Transportation (Medical): No  . Lack of Transportation (Non-Medical): No  Physical Activity: Not on file  Stress: Not on file  Social Connections: Not on file    Family History  Problem Relation  Age of Onset  . Hypertension Mother      Current Outpatient Medications:  .  Blood Pressure Monitoring (BLOOD PRESSURE KIT) DEVI, 1 Device by Does not apply route daily. ICD 10: Z34.00, Disp: 1 Device, Rfl: 0 .  Prenatal Vit-Fe Fumarate-FA (PRENATAL VITAMIN PO), Take 1 tablet by mouth daily., Disp: , Rfl:  .  triamcinolone cream (KENALOG) 0.1 %, 1 application to affected area, Disp: , Rfl:  .  acetaminophen (TYLENOL) 325 MG tablet, Take 2 tablets (650 mg total) by mouth every 4 (four) hours as needed (for pain scale < 4). (Patient not taking: No sig reported), Disp: 30 tablet, Rfl: 0  No Known Allergies  Review of Systems: Negative except for what is mentioned in HPI.  Objective:   Vitals:   07/20/20 1012  BP: 123/70  Pulse: 76  Weight: 143 lb 8 oz (65.1 kg)    Fetal Status: Fetal Heart Rate (bpm): 160   Movement: Absent     Physical Exam: BP 123/70   Pulse 76   Wt 143 lb 8 oz (65.1 kg)   LMP 05/06/2020   BMI 25.42 kg/m  CONSTITUTIONAL: Well-developed, well-nourished female in no acute distress.  NEUROLOGIC:  Alert and oriented to person, place, and time. Normal reflexes, muscle tone coordination. No cranial nerve deficit noted. PSYCHIATRIC: Normal mood and affect. Normal behavior. Normal judgment and thought content. SKIN: Skin is warm and dry. No rash noted. Not diaphoretic. No erythema. No pallor. HENT:  Normocephalic, atraumatic, External right and left ear normal. Oropharynx is clear and moist EYES: Conjunctivae and EOM are normal.  No scleral icterus.  NECK: Normal range of motion, supple, no masses CARDIOVASCULAR: Normal heart rate noted, regular rhythm RESPIRATORY: Effort and breath sounds normal, no problems with respiration noted BREASTS: deferred ABDOMEN: Soft, nontender, nondistended, gravid. GU: normal appearing external female genitalia, multiparous normal appearing cervix, scant white discharge in vagina, no lesions noted Bimanual: 10 weeks sized uterus, no  adnexal tenderness or palpable lesions noted MUSCULOSKELETAL: Normal range of motion. EXT:  No edema and no tenderness. 2+ distal pulses.   Assessment and Plan:  Pregnancy: G2P1001 at 35w5dby LMP  1. Supervision of low-risk pregnancy, first trimester Routine care at this time  2. History of gestational hypertension Will get baseline labs increase HTN later noted, normal BP at this time  3. [redacted] weeks gestation of pregnancy   4. History of herpes genitalis Prophylaxis at 36 weeks, no recent outbreaks  5. Fibroids Per pt, small solitary fibroid in the past  6. Alpha thalassemia silent carrier   7. Short interval between pregnancies affecting pregnancy in first trimester, antepartum Pt still breast feeding   Preterm labor symptoms and general obstetric precautions including but not limited to vaginal bleeding, contractions, leaking of fluid and fetal movement were reviewed in detail with the patient.  Please refer to After Visit Summary for other counseling recommendations.   Return in about 4 weeks (around 08/17/2020) for ROB, in person.  LGriffin Basil4/22/2022 11:10 AM

## 2020-07-21 LAB — CBC/D/PLT+RPR+RH+ABO+RUB AB...
Antibody Screen: NEGATIVE
Basophils Absolute: 0 10*3/uL (ref 0.0–0.2)
Basos: 0 %
EOS (ABSOLUTE): 0.2 10*3/uL (ref 0.0–0.4)
Eos: 2 %
HCV Ab: 0.2 s/co ratio (ref 0.0–0.9)
HIV Screen 4th Generation wRfx: NONREACTIVE
Hematocrit: 39.8 % (ref 34.0–46.6)
Hemoglobin: 13.1 g/dL (ref 11.1–15.9)
Hepatitis B Surface Ag: NEGATIVE
Immature Grans (Abs): 0 10*3/uL (ref 0.0–0.1)
Immature Granulocytes: 0 %
Lymphocytes Absolute: 2.5 10*3/uL (ref 0.7–3.1)
Lymphs: 28 %
MCH: 27.5 pg (ref 26.6–33.0)
MCHC: 32.9 g/dL (ref 31.5–35.7)
MCV: 83 fL (ref 79–97)
Monocytes Absolute: 0.6 10*3/uL (ref 0.1–0.9)
Monocytes: 7 %
Neutrophils Absolute: 5.4 10*3/uL (ref 1.4–7.0)
Neutrophils: 63 %
Platelets: 296 10*3/uL (ref 150–450)
RBC: 4.77 x10E6/uL (ref 3.77–5.28)
RDW: 13.3 % (ref 11.7–15.4)
RPR Ser Ql: NONREACTIVE
Rh Factor: POSITIVE
Rubella Antibodies, IGG: 2.19 index (ref >0.99)
WBC: 8.7 10*3/uL (ref 3.4–10.8)

## 2020-07-21 LAB — COMPREHENSIVE METABOLIC PANEL
ALT: 11 IU/L (ref 0–32)
AST: 15 IU/L (ref 0–40)
Albumin/Globulin Ratio: 1.6 (ref 1.2–2.2)
Albumin: 4.2 g/dL (ref 3.9–5.0)
Alkaline Phosphatase: 59 IU/L (ref 44–121)
BUN/Creatinine Ratio: 15 (ref 9–23)
BUN: 9 mg/dL (ref 6–20)
Bilirubin Total: <0.2 mg/dL (ref 0.0–1.2)
CO2: 21 mmol/L (ref 20–29)
Calcium: 9.2 mg/dL (ref 8.7–10.2)
Chloride: 103 mmol/L (ref 96–106)
Creatinine, Ser: 0.62 mg/dL (ref 0.57–1.00)
Globulin, Total: 2.7 g/dL (ref 1.5–4.5)
Glucose: 73 mg/dL (ref 65–99)
Potassium: 4 mmol/L (ref 3.5–5.2)
Sodium: 138 mmol/L (ref 134–144)
Total Protein: 6.9 g/dL (ref 6.0–8.5)
eGFR: 123 mL/min/{1.73_m2} (ref >59)

## 2020-07-21 LAB — PROTEIN / CREATININE RATIO, URINE
Creatinine, Urine: 97.8 mg/dL
Protein, Ur: 8.3 mg/dL
Protein/Creat Ratio: 85 mg/g creat (ref 0–200)

## 2020-07-21 LAB — HEMOGLOBIN A1C
Est. average glucose Bld gHb Est-mCnc: 123 mg/dL
Hgb A1c MFr Bld: 5.9 % — ABNORMAL HIGH (ref 4.8–5.6)

## 2020-07-21 LAB — HCV INTERPRETATION

## 2020-07-22 LAB — CULTURE, OB URINE

## 2020-07-22 LAB — URINE CULTURE, OB REFLEX

## 2020-07-23 ENCOUNTER — Encounter: Payer: Self-pay | Admitting: General Practice

## 2020-07-23 ENCOUNTER — Telehealth: Payer: Self-pay | Admitting: General Practice

## 2020-07-23 LAB — GC/CHLAMYDIA PROBE AMP (~~LOC~~) NOT AT ARMC
Chlamydia: NEGATIVE
Comment: NEGATIVE
Comment: NORMAL
Neisseria Gonorrhea: NEGATIVE

## 2020-07-23 NOTE — Telephone Encounter (Signed)
Called patient to discuss scan results she saw. Told patient she doesn't have multiple myeloma and I'm not sure how that came up. Discussed what was scanned into her chart and ensured patient could see images. Patient sent screenshots of mychart pages she was seeing for our reference. Told patient I'm not sure how that information got there as that hasn't happened before. Told patient I would pass information along to office manager but to not worry as she does not have cancer. Patient verbalized understanding.

## 2020-08-16 ENCOUNTER — Other Ambulatory Visit: Payer: Self-pay

## 2020-08-16 ENCOUNTER — Ambulatory Visit (INDEPENDENT_AMBULATORY_CARE_PROVIDER_SITE_OTHER): Payer: Medicaid Other | Admitting: Obstetrics and Gynecology

## 2020-08-16 VITALS — BP 129/72 | HR 77 | Wt 149.5 lb

## 2020-08-16 DIAGNOSIS — Z3482 Encounter for supervision of other normal pregnancy, second trimester: Secondary | ICD-10-CM

## 2020-08-16 DIAGNOSIS — Z8619 Personal history of other infectious and parasitic diseases: Secondary | ICD-10-CM

## 2020-08-16 DIAGNOSIS — D563 Thalassemia minor: Secondary | ICD-10-CM

## 2020-08-16 DIAGNOSIS — Z3A14 14 weeks gestation of pregnancy: Secondary | ICD-10-CM

## 2020-08-16 DIAGNOSIS — Z872 Personal history of diseases of the skin and subcutaneous tissue: Secondary | ICD-10-CM

## 2020-08-16 DIAGNOSIS — O09892 Supervision of other high risk pregnancies, second trimester: Secondary | ICD-10-CM

## 2020-08-16 DIAGNOSIS — Z8759 Personal history of other complications of pregnancy, childbirth and the puerperium: Secondary | ICD-10-CM

## 2020-08-16 MED ORDER — TRIAMCINOLONE ACETONIDE 0.1 % EX CREA: TOPICAL_CREAM | CUTANEOUS | 1 refills | Status: DC

## 2020-08-16 NOTE — Progress Notes (Signed)
   PRENATAL VISIT NOTE  Subjective:  Sally Crosby is a 30 y.o. G2P1001 at [redacted]w[redacted]d being seen today for ongoing prenatal care.  She is currently monitored for the following issues for this low-risk pregnancy and has History of herpes genitalis; Fibroids; Alpha thalassemia silent carrier; H/O eczema; Type 3a perineal laceration; History of gestational hypertension; Anxiety; 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 adolescence; Encounter for supervision of other normal pregnancy, second trimester; [redacted] weeks gestation of pregnancy; Short interval between pregnancies affecting pregnancy in second trimester, antepartum; History of postpartum depression, currently pregnant; and [redacted] weeks gestation of pregnancy on their problem list.  Patient doing well with no acute concerns today. She reports no complaints.  Contractions: Not present. Vag. Bleeding: None.  Movement: Absent. Denies leaking of fluid.   The following portions of the patient's history were reviewed and updated as appropriate: allergies, current medications, past family history, past medical history, past social history, past surgical history and problem list. Problem list updated.  Objective:   Vitals:   08/16/20 1010  BP: 129/72  Pulse: 77  Weight: 149 lb 8 oz (67.8 kg)    Fetal Status: Fetal Heart Rate (bpm): 151   Movement: Absent     General:  Alert, oriented and cooperative. Patient is in no acute distress.  Skin: Skin is warm and dry. No rash noted.   Cardiovascular: Normal heart rate noted  Respiratory: Normal respiratory effort, no problems with respiration noted  Abdomen: Soft, gravid, appropriate for gestational age.  Pain/Pressure: Absent     Pelvic: Cervical exam deferred        Extremities: Normal range of motion.  Edema: None  Mental Status:  Normal mood and affect. Normal behavior. Normal judgment and thought content.   Assessment and Plan:  Pregnancy: G2P1001  at [redacted]w[redacted]d  1. Encounter for supervision of other normal pregnancy, second trimester Routine exam  2. History of gestational hypertension No HTN today  3. History of herpes genitalis   4. Short interval between pregnancies affecting pregnancy in second trimester, antepartum   5. Alpha thalassemia silent carrier   6. [redacted] weeks gestation of pregnancy   7. Type 3a perineal laceration   8. H/O eczema  - triamcinolone cream (KENALOG) 0.1 %; 1 application to affected area  Dispense: 30 g; Refill: 1  Preterm labor symptoms and general obstetric precautions including but not limited to vaginal bleeding, contractions, leaking of fluid and fetal movement were reviewed in detail with the patient.  Please refer to After Visit Summary for other counseling recommendations.   Return in about 4 weeks (around 09/13/2020) for ROB, in person.   Lynnda Shields, MD Faculty Attending Center for [facility]

## 2020-09-17 ENCOUNTER — Other Ambulatory Visit: Payer: Self-pay

## 2020-09-17 ENCOUNTER — Ambulatory Visit (INDEPENDENT_AMBULATORY_CARE_PROVIDER_SITE_OTHER): Payer: Medicaid Other | Admitting: Family Medicine

## 2020-09-17 VITALS — BP 126/84 | HR 89 | Wt 153.1 lb

## 2020-09-17 DIAGNOSIS — Z8759 Personal history of other complications of pregnancy, childbirth and the puerperium: Secondary | ICD-10-CM

## 2020-09-17 DIAGNOSIS — O09892 Supervision of other high risk pregnancies, second trimester: Secondary | ICD-10-CM

## 2020-09-17 DIAGNOSIS — Z3A19 19 weeks gestation of pregnancy: Secondary | ICD-10-CM

## 2020-09-17 DIAGNOSIS — Z3482 Encounter for supervision of other normal pregnancy, second trimester: Secondary | ICD-10-CM

## 2020-09-17 MED ORDER — PRENATAL VITAMIN 27-0.8 MG PO TABS: 1.0000 | ORAL_TABLET | Freq: Every day | ORAL | 2 refills | Status: DC

## 2020-09-17 MED ORDER — ASPIRIN EC 81 MG PO TBEC: 81.0000 mg | DELAYED_RELEASE_TABLET | Freq: Every day | ORAL | 3 refills | Status: DC

## 2020-09-17 NOTE — Progress Notes (Signed)
   PRENATAL VISIT NOTE  Subjective:  Sally Crosby is a 30 y.o. G2P1001 at [redacted]w[redacted]d being seen today for ongoing prenatal care.  She is currently monitored for the following issues for this low-risk pregnancy and has History of herpes genitalis; Fibroids; Alpha thalassemia silent carrier; H/O eczema; Type 3a perineal laceration; History of gestational hypertension; Anxiety; 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 adolescence; Encounter for supervision of other normal pregnancy, second trimester; Short interval between pregnancies affecting pregnancy in second trimester, antepartum; and History of postpartum depression, currently pregnant on their problem list.  Patient reports no complaints.  Contractions: Not present. Vag. Bleeding: None.  Movement: Present. Denies leaking of fluid.   The following portions of the patient's history were reviewed and updated as appropriate: allergies, current medications, past family history, past medical history, past social history, past surgical history and problem list.   Objective:   Vitals:   09/17/20 0957  BP: 126/84  Pulse: 89  Weight: 153 lb 1.6 oz (69.4 kg)    Fetal Status: Fetal Heart Rate (bpm): 145   Movement: Present     General:  Alert, oriented and cooperative. Patient is in no acute distress.  Skin: Skin is warm and dry. No rash noted.   Cardiovascular: Normal heart rate noted  Respiratory: Normal respiratory effort, no problems with respiration noted  Abdomen: Soft, gravid, appropriate for gestational age.  Pain/Pressure: Absent     Pelvic: Cervical exam deferred        Extremities: Normal range of motion.  Edema: None  Mental Status: Normal mood and affect. Normal behavior. Normal judgment and thought content.   Assessment and Plan:  Pregnancy: G2P1001 at [redacted]w[redacted]d 1. Encounter for supervision of other normal pregnancy, second trimester Had a high A1C will check random CBG AFP  today - Glucose - AFP, Serum, Open Spina Bifida - Prenatal Vit-Fe Fumarate-FA (PRENATAL VITAMIN) 27-0.8 MG TABS; Take 1 tablet by mouth daily.  Dispense: 90 tablet; Refill: 2  2. History of gestational hypertension Begin ASA - aspirin EC 81 MG tablet; Take 1 tablet (81 mg total) by mouth daily. Swallow whole.  Dispense: 90 tablet; Refill: 3  3. Short interval between pregnancies affecting pregnancy in second trimester, antepartum   4. Type 3a perineal laceration   General obstetric precautions including but not limited to vaginal bleeding, contractions, leaking of fluid and fetal movement were reviewed in detail with the patient. Please refer to After Visit Summary for other counseling recommendations.   Return in about 4 weeks (around 10/15/2020) for [facility].  Future Appointments  Date Time Provider [place]  09/21/2020  8:15 AM [facility] US[facility]  10/15/2020 10:55 AM Griffin Basil, MD [facility]    Donnamae Jude, MD

## 2020-09-21 ENCOUNTER — Other Ambulatory Visit: Payer: Self-pay

## 2020-09-21 ENCOUNTER — Ambulatory Visit: Payer: Medicaid Other | Attending: Obstetrics and Gynecology

## 2020-09-21 ENCOUNTER — Other Ambulatory Visit: Payer: Self-pay | Admitting: Obstetrics and Gynecology

## 2020-09-21 DIAGNOSIS — Z3491 Encounter for supervision of normal pregnancy, unspecified, first trimester: Secondary | ICD-10-CM | POA: Diagnosis present

## 2020-09-26 ENCOUNTER — Telehealth: Payer: Self-pay | Admitting: *Deleted

## 2020-09-26 NOTE — Telephone Encounter (Signed)
Received a call from [facility] , she  is asking for explanation of result of AFP and glucose drawn 09/17/20. I informed her glucose was normal.  I informed her AFP has not been completed because they need  missing clinical information. I informed her I will call [facility] and give the missing information and she can check back later today to see results. She voices understanding. I called [facility] and they informed me only clinical information missing is weight. I gave them her weight for 09/17/20. They informed results will be calculated and updated later today. Darlette Dubow,RN

## 2020-10-09 LAB — AFP, SERUM, OPEN SPINA BIFIDA
AFP MoM: 1.65
AFP Value: 94.9 ng/mL
Gest. Age on Collection Date: 19.1 weeks
Maternal Age At EDD: 31 yr
OSBR Risk 1 IN: 3696
Test Results:: NEGATIVE
Weight: 153 [lb_av]

## 2020-10-09 LAB — GLUCOSE, RANDOM: Glucose: 82 mg/dL (ref 65–99)

## 2020-10-15 ENCOUNTER — Ambulatory Visit (INDEPENDENT_AMBULATORY_CARE_PROVIDER_SITE_OTHER): Payer: Medicaid Other | Admitting: Nurse Practitioner

## 2020-10-15 ENCOUNTER — Other Ambulatory Visit: Payer: Self-pay

## 2020-10-15 VITALS — BP 125/70 | HR 93 | Wt 156.1 lb

## 2020-10-15 DIAGNOSIS — Z8759 Personal history of other complications of pregnancy, childbirth and the puerperium: Secondary | ICD-10-CM

## 2020-10-15 DIAGNOSIS — Z3A23 23 weeks gestation of pregnancy: Secondary | ICD-10-CM

## 2020-10-15 DIAGNOSIS — Z3482 Encounter for supervision of other normal pregnancy, second trimester: Secondary | ICD-10-CM

## 2020-10-15 DIAGNOSIS — Z8619 Personal history of other infectious and parasitic diseases: Secondary | ICD-10-CM

## 2020-10-15 NOTE — Progress Notes (Signed)
Pt states is not taking Baby Asprin.

## 2020-10-15 NOTE — Progress Notes (Signed)
    Subjective:  Sally Crosby is a 30 y.o. G2P1001 at [redacted]w[redacted]d being seen today for ongoing prenatal care.  She is currently monitored for the following issues for this low-risk pregnancy and has History of herpes genitalis; Fibroids; Alpha thalassemia silent carrier; H/O eczema; Type 3a perineal laceration; History of gestational hypertension; Anxiety; 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 adolescence; Encounter for supervision of other normal pregnancy, second trimester; Short interval between pregnancies affecting pregnancy in second trimester, antepartum; and History of postpartum depression, currently pregnant on their problem list.  Patient reports no complaints.  Contractions: Not present. Vag. Bleeding: None.  Movement: Present. Denies leaking of fluid.   The following portions of the patient's history were reviewed and updated as appropriate: allergies, current medications, past family history, past medical history, past social history, past surgical history and problem list. Problem list updated.  Objective:   Vitals:   10/15/20 1109  BP: 125/70  Pulse: 93  Weight: 156 lb 1.6 oz (70.8 kg)    Fetal Status: Fetal Heart Rate (bpm): 146 Fundal Height: 25 cm Movement: Present     General:  Alert, oriented and cooperative. Patient is in no acute distress.  Skin: Skin is warm and dry. No rash noted.   Cardiovascular: Normal heart rate noted  Respiratory: Normal respiratory effort, no problems with respiration noted  Abdomen: Soft, gravid, appropriate for gestational age. Pain/Pressure: Absent     Pelvic:  Cervical exam deferred        Extremities: Normal range of motion.  Edema: None  Mental Status: Normal mood and affect. Normal behavior. Normal judgment and thought content.   Urinalysis:      Assessment and Plan:  Pregnancy: G2P1001 at [redacted]w[redacted]d  1. Encounter for supervision of other normal pregnancy, second  trimester Encouraged to consider contraceptive options - thinking about them Reviewed next visit - fasting for glucola testing and plans to get TDAP vaccine - reviewed benefits for the baby.  Had in last pregnancy also.  2. History of gestational hypertension Has low dose aspirin at home Has not started - discussed with MFM Had very little problem with GHTN- only after delivery and stabilized before discharge Encouraged her to begin ASA - unsure if she will start  3. History of herpes genitalis Will treat later in pregnancy   Preterm labor symptoms and general obstetric precautions including but not limited to vaginal bleeding, contractions, leaking of fluid and fetal movement were reviewed in detail with the patient. Please refer to After Visit Summary for other counseling recommendations.  Return in about 4 weeks (around 11/12/2020) for early AM ROB for Fasting glucola/labs and ROB.  Earlie Server, RN, MSN, NP-BC Nurse Practitioner, [facility] for [employer], [place] [facility] 10/15/2020 12:27 PM

## 2020-11-14 ENCOUNTER — Other Ambulatory Visit: Payer: Self-pay

## 2020-11-14 ENCOUNTER — Encounter: Payer: Self-pay | Admitting: Obstetrics and Gynecology

## 2020-11-14 ENCOUNTER — Other Ambulatory Visit: Payer: Medicaid Other

## 2020-11-14 ENCOUNTER — Ambulatory Visit (INDEPENDENT_AMBULATORY_CARE_PROVIDER_SITE_OTHER): Payer: Medicaid Other | Admitting: Obstetrics and Gynecology

## 2020-11-14 ENCOUNTER — Other Ambulatory Visit: Payer: Self-pay | Admitting: General Practice

## 2020-11-14 VITALS — BP 127/60 | HR 81 | Wt 161.0 lb

## 2020-11-14 DIAGNOSIS — Z3A27 27 weeks gestation of pregnancy: Secondary | ICD-10-CM

## 2020-11-14 DIAGNOSIS — Z3482 Encounter for supervision of other normal pregnancy, second trimester: Secondary | ICD-10-CM

## 2020-11-14 DIAGNOSIS — Z23 Encounter for immunization: Secondary | ICD-10-CM

## 2020-11-14 NOTE — Progress Notes (Signed)
Subjective:  Sally Crosby is a 30 y.o. G2P1001 at 19w3dbeing seen today for ongoing prenatal care.  She is currently monitored for the following issues for this low-risk pregnancy and has History of herpes genitalis; Fibroids; Alpha thalassemia silent carrier; H/O eczema; Type 3a perineal laceration; History of gestational hypertension; Anxiety; 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 adolescence; Encounter for supervision of other normal pregnancy, second trimester; Short interval between pregnancies affecting pregnancy in second trimester, antepartum; and History of postpartum depression, currently pregnant on their problem list.  Patient reports no complaints.  Contractions: Not present. Vag. Bleeding: None.  Movement: Present. Denies leaking of fluid.   The following portions of the patient's history were reviewed and updated as appropriate: allergies, current medications, past family history, past medical history, past social history, past surgical history and problem list. Problem list updated.  Objective:   Vitals:   11/14/20 0844  BP: 127/60  Pulse: 81  Weight: 161 lb (73 kg)    Fetal Status: Fetal Heart Rate (bpm): 155   Movement: Present     General:  Alert, oriented and cooperative. Patient is in no acute distress.  Skin: Skin is warm and dry. No rash noted.   Cardiovascular: Normal heart rate noted  Respiratory: Normal respiratory effort, no problems with respiration noted  Abdomen: Soft, gravid, appropriate for gestational age. Pain/Pressure: Absent     Pelvic:  Cervical exam deferred        Extremities: Normal range of motion.  Edema: None  Mental Status: Normal mood and affect. Normal behavior. Normal judgment and thought content.   Urinalysis:      Assessment and Plan:  Pregnancy: G2P1001 at 257w3d1. Encounter for supervision of other normal pregnancy, second trimester Stable 28 week labs today - Tdap  vaccine greater than or equal to 7yo IM  2. H/O GHTN BP stable Qd BASA  3. H/O HSV Suppression at 36 weeks  Preterm labor symptoms and general obstetric precautions including but not limited to vaginal bleeding, contractions, leaking of fluid and fetal movement were reviewed in detail with the patient. Please refer to After Visit Summary for other counseling recommendations.  No follow-ups on file.   ErChancy MilroyMD

## 2020-11-15 LAB — CBC
Hematocrit: 33.5 % — ABNORMAL LOW (ref 34.0–46.6)
Hemoglobin: 11.2 g/dL (ref 11.1–15.9)
MCH: 28.2 pg (ref 26.6–33.0)
MCHC: 33.4 g/dL (ref 31.5–35.7)
MCV: 84 fL (ref 79–97)
Platelets: 178 10*3/uL (ref 150–450)
RBC: 3.97 x10E6/uL (ref 3.77–5.28)
RDW: 13.7 % (ref 11.7–15.4)
WBC: 9.3 10*3/uL (ref 3.4–10.8)

## 2020-11-15 LAB — RPR: RPR Ser Ql: NONREACTIVE

## 2020-11-15 LAB — GLUCOSE TOLERANCE, 2 HOURS W/ 1HR
Glucose, 1 hour: 143 mg/dL (ref 65–179)
Glucose, 2 hour: 116 mg/dL (ref 65–152)
Glucose, Fasting: 77 mg/dL (ref 65–91)

## 2020-11-15 LAB — HIV ANTIBODY (ROUTINE TESTING W REFLEX): HIV Screen 4th Generation wRfx: NONREACTIVE

## 2020-11-30 ENCOUNTER — Encounter: Payer: Self-pay | Admitting: Obstetrics and Gynecology

## 2020-11-30 ENCOUNTER — Ambulatory Visit (INDEPENDENT_AMBULATORY_CARE_PROVIDER_SITE_OTHER): Payer: Medicaid Other | Admitting: Obstetrics and Gynecology

## 2020-11-30 ENCOUNTER — Other Ambulatory Visit: Payer: Self-pay

## 2020-11-30 VITALS — BP 123/56 | HR 86 | Wt 161.2 lb

## 2020-11-30 DIAGNOSIS — Z3482 Encounter for supervision of other normal pregnancy, second trimester: Secondary | ICD-10-CM

## 2020-11-30 DIAGNOSIS — Z3A29 29 weeks gestation of pregnancy: Secondary | ICD-10-CM

## 2020-11-30 DIAGNOSIS — Z8619 Personal history of other infectious and parasitic diseases: Secondary | ICD-10-CM

## 2020-11-30 DIAGNOSIS — Z23 Encounter for immunization: Secondary | ICD-10-CM | POA: Diagnosis not present

## 2020-11-30 DIAGNOSIS — Z8759 Personal history of other complications of pregnancy, childbirth and the puerperium: Secondary | ICD-10-CM

## 2020-11-30 NOTE — Progress Notes (Signed)
   PRENATAL VISIT NOTE  Subjective:  Sally Crosby is a 30 y.o. G2P1001 at 90w5dbeing seen today for ongoing prenatal care.  She is currently monitored for the following issues for this low-risk pregnancy and has History of herpes genitalis; Alpha thalassemia silent carrier; H/O eczema; Type 3a perineal laceration; History of gestational hypertension; Anxiety; Encounter for supervision of other normal pregnancy, second trimester; Short interval between pregnancies affecting pregnancy in second trimester, antepartum; and History of postpartum depression, currently pregnant on their problem list.  Patient reports no complaints.  Contractions: Not present. Vag. Bleeding: None.  Movement: Present. Denies leaking of fluid.   The following portions of the patient's history were reviewed and updated as appropriate: allergies, current medications, past family history, past medical history, past social history, past surgical history and problem list.   Objective:   Vitals:   11/30/20 0830  BP: (!) 123/56  Pulse: 86  Weight: 161 lb 3.2 oz (73.1 kg)    Fetal Status: Fetal Heart Rate (bpm): 143 Fundal Height: 30 cm Movement: Present     General:  Alert, oriented and cooperative. Patient is in no acute distress.  Skin: Skin is warm and dry. No rash noted.   Cardiovascular: Normal heart rate noted  Respiratory: Normal respiratory effort, no problems with respiration noted  Abdomen: Soft, gravid, appropriate for gestational age.  Pain/Pressure: Absent     Pelvic: Cervical exam deferred        Extremities: Normal range of motion.  Edema: None  Mental Status: Normal mood and affect. Normal behavior. Normal judgment and thought content.   Assessment and Plan:  Pregnancy: G2P1001 at 281w5d. [redacted] weeks gestation of pregnancy Normal 28wk labs. BC options d/w her  2. Encounter for supervision of other normal pregnancy, second trimester  3. History of herpes genitalis Ppx at 35-36wks  4. History  of gestational hypertension Continue low dose asa  Preterm labor symptoms and general obstetric precautions including but not limited to vaginal bleeding, contractions, leaking of fluid and fetal movement were reviewed in detail with the patient. Please refer to After Visit Summary for other counseling recommendations.   Return in about 2 weeks (around 12/14/2020) for 2-3wk , low risk ob, in person, md or app.  No future appointments.  ChAletha HalimMD

## 2020-12-19 ENCOUNTER — Other Ambulatory Visit: Payer: Self-pay

## 2020-12-19 ENCOUNTER — Ambulatory Visit (INDEPENDENT_AMBULATORY_CARE_PROVIDER_SITE_OTHER): Payer: Medicaid Other | Admitting: Obstetrics and Gynecology

## 2020-12-19 VITALS — BP 115/65 | HR 103 | Wt 164.0 lb

## 2020-12-19 DIAGNOSIS — Z8619 Personal history of other infectious and parasitic diseases: Secondary | ICD-10-CM

## 2020-12-19 DIAGNOSIS — Z8759 Personal history of other complications of pregnancy, childbirth and the puerperium: Secondary | ICD-10-CM

## 2020-12-19 DIAGNOSIS — Z3A32 32 weeks gestation of pregnancy: Secondary | ICD-10-CM

## 2020-12-19 NOTE — Progress Notes (Signed)
   PRENATAL VISIT NOTE  Subjective:  Sally Crosby is a 30 y.o. G2P1001 at [redacted]w[redacted]d being seen today for ongoing prenatal care.  She is currently monitored for the following issues for this low-risk pregnancy and has History of herpes genitalis; Alpha thalassemia silent carrier; H/O eczema; Type 3a perineal laceration; History of gestational hypertension; Anxiety; Encounter for supervision of other normal pregnancy, second trimester; Short interval between pregnancies affecting pregnancy in second trimester, antepartum; and History of postpartum depression, currently pregnant on their problem list.  Patient reports no complaints.  Contractions: Not present. Vag. Bleeding: None.  Movement: Present. Denies leaking of fluid.   The following portions of the patient's history were reviewed and updated as appropriate: allergies, current medications, past family history, past medical history, past social history, past surgical history and problem list.   Objective:   Vitals:   12/19/20 0903  BP: 115/65  Pulse: (!) 103  Weight: 164 lb (74.4 kg)    Fetal Status: Fetal Heart Rate (bpm): 130 Fundal Height: 33 cm Movement: Present     General:  Alert, oriented and cooperative. Patient is in no acute distress.  Skin: Skin is warm and dry. No rash noted.   Cardiovascular: Normal heart rate noted  Respiratory: Normal respiratory effort, no problems with respiration noted  Abdomen: Soft, gravid, appropriate for gestational age.  Pain/Pressure: Present     Pelvic: Cervical exam deferred        Extremities: Normal range of motion.  Edema: None  Mental Status: Normal mood and affect. Normal behavior. Normal judgment and thought content.   Assessment and Plan:  Pregnancy: G2P1001 at [redacted]w[redacted]d 1. [redacted] weeks gestation of pregnancy GBS at 36wks  2. Encounter for supervision of other normal pregnancy, second trimester   3. History of herpes genitalis Ppx at 35-36wks   4. History of gestational  hypertension Continue low dose asa  Preterm labor symptoms and general obstetric precautions including but not limited to vaginal bleeding, contractions, leaking of fluid and fetal movement were reviewed in detail with the patient. Please refer to After Visit Summary for other counseling recommendations.   Return 2-3wks, for low risk ob, in person, md or app.  No future appointments.  Aletha Halim, MD

## 2021-01-09 ENCOUNTER — Encounter: Payer: Medicaid Other | Admitting: Nurse Practitioner

## 2021-01-09 ENCOUNTER — Other Ambulatory Visit: Payer: Self-pay

## 2021-01-09 ENCOUNTER — Ambulatory Visit (INDEPENDENT_AMBULATORY_CARE_PROVIDER_SITE_OTHER): Payer: Medicaid Other | Admitting: Obstetrics and Gynecology

## 2021-01-09 VITALS — BP 120/73 | HR 79 | Wt 165.8 lb

## 2021-01-09 DIAGNOSIS — Z3A35 35 weeks gestation of pregnancy: Secondary | ICD-10-CM

## 2021-01-09 DIAGNOSIS — Z8759 Personal history of other complications of pregnancy, childbirth and the puerperium: Secondary | ICD-10-CM

## 2021-01-09 DIAGNOSIS — Z3482 Encounter for supervision of other normal pregnancy, second trimester: Secondary | ICD-10-CM

## 2021-01-09 DIAGNOSIS — Z8619 Personal history of other infectious and parasitic diseases: Secondary | ICD-10-CM

## 2021-01-09 MED ORDER — VALACYCLOVIR HCL 500 MG PO TABS: 500.0000 mg | ORAL_TABLET | Freq: Two times a day (BID) | ORAL | 0 refills | Status: DC

## 2021-01-09 NOTE — Progress Notes (Signed)
   PRENATAL VISIT NOTE  Subjective:  Sally Crosby is a 30 y.o. G2P1001 at [redacted]w[redacted]d being seen today for ongoing prenatal care.  She is currently monitored for the following issues for this low-risk pregnancy and has History of herpes genitalis; Alpha thalassemia silent carrier; H/O eczema; Type 3a perineal laceration; History of gestational hypertension; Anxiety; Encounter for supervision of other normal pregnancy, second trimester; Short interval between pregnancies affecting pregnancy in second trimester, antepartum; and History of postpartum depression, currently pregnant on their problem list.  Patient reports no complaints.  Contractions: Irritability. Vag. Bleeding: None.  Movement: Present. Denies leaking of fluid.   The following portions of the patient's history were reviewed and updated as appropriate: allergies, current medications, past family history, past medical history, past social history, past surgical history and problem list.   Objective:   Vitals:   01/09/21 1503  BP: 120/73  Pulse: 79  Weight: 165 lb 12.8 oz (75.2 kg)    Fetal Status: Fetal Heart Rate (bpm): 131 Fundal Height: 35 cm Movement: Present  Presentation: Vertex  General:  Alert, oriented and cooperative. Patient is in no acute distress.  Skin: Skin is warm and dry. No rash noted.   Cardiovascular: Normal heart rate noted  Respiratory: Normal respiratory effort, no problems with respiration noted  Abdomen: Soft, gravid, appropriate for gestational age.  Pain/Pressure: Present     Pelvic: Cervical exam deferred        Extremities: Normal range of motion.  Edema: None  Mental Status: Normal mood and affect. Normal behavior. Normal judgment and thought content.   Assessment and Plan:  Pregnancy: G2P1001 at [redacted]w[redacted]d 1. History of herpes genitalis No s/s. Ppx valtrex sent in  2. History of gestational hypertension Continue low dose asa  3. Encounter for supervision of other normal pregnancy, second  trimester Gbs nv  Preterm labor symptoms and general obstetric precautions including but not limited to vaginal bleeding, contractions, leaking of fluid and fetal movement were reviewed in detail with the patient. Please refer to After Visit Summary for other counseling recommendations.   Return in about 1 week (around 01/16/2021) for low risk ob, in person, md or app.  No future appointments.  Aletha Halim, MD

## 2021-01-21 ENCOUNTER — Other Ambulatory Visit: Payer: Self-pay

## 2021-01-21 ENCOUNTER — Ambulatory Visit (INDEPENDENT_AMBULATORY_CARE_PROVIDER_SITE_OTHER): Payer: Medicaid Other | Admitting: Obstetrics and Gynecology

## 2021-01-21 ENCOUNTER — Other Ambulatory Visit (HOSPITAL_COMMUNITY)
Admission: RE | Admit: 2021-01-21 | Discharge: 2021-01-21 | Disposition: A | Discharge status: Home / Self Care | Payer: Medicaid Other | Source: Ambulatory Visit | Attending: Obstetrics and Gynecology | Admitting: Obstetrics and Gynecology

## 2021-01-21 VITALS — BP 114/74 | HR 85 | Wt 167.9 lb

## 2021-01-21 DIAGNOSIS — Z8759 Personal history of other complications of pregnancy, childbirth and the puerperium: Secondary | ICD-10-CM

## 2021-01-21 DIAGNOSIS — O99891 Other specified diseases and conditions complicating pregnancy: Secondary | ICD-10-CM

## 2021-01-21 DIAGNOSIS — Z3483 Encounter for supervision of other normal pregnancy, third trimester: Secondary | ICD-10-CM | POA: Diagnosis not present

## 2021-01-21 DIAGNOSIS — D563 Thalassemia minor: Secondary | ICD-10-CM

## 2021-01-21 DIAGNOSIS — Z8659 Personal history of other mental and behavioral disorders: Secondary | ICD-10-CM

## 2021-01-21 DIAGNOSIS — Z8619 Personal history of other infectious and parasitic diseases: Secondary | ICD-10-CM

## 2021-01-21 NOTE — Progress Notes (Signed)
   PRENATAL VISIT NOTE  Subjective:  Sally Crosby is a 30 y.o. G2P1001 at [redacted]w[redacted]d being seen today for ongoing prenatal care.  She is currently monitored for the following issues for this low-risk pregnancy and has History of herpes genitalis; Alpha thalassemia silent carrier; H/O eczema; Type 3a perineal laceration; History of gestational hypertension; Anxiety; Encounter for supervision of other normal pregnancy, third trimester; Short interval between pregnancies affecting pregnancy in second trimester, antepartum; and History of postpartum depression, currently pregnant on their problem list.  Patient doing well with no acute concerns today. She reports no complaints.  Contractions: Not present. Vag. Bleeding: None.  Movement: Present. Denies leaking of fluid.   The following portions of the patient's history were reviewed and updated as appropriate: allergies, current medications, past family history, past medical history, past social history, past surgical history and problem list. Problem list updated.  Objective:   Vitals:   01/21/21 0948  BP: 114/74  Pulse: 85  Weight: 167 lb 14.4 oz (76.2 kg)    Fetal Status: Fetal Heart Rate (bpm): 150 Fundal Height: 37 cm Movement: Present  Presentation: Vertex  General:  Alert, oriented and cooperative. Patient is in no acute distress.  Skin: Skin is warm and dry. No rash noted.   Cardiovascular: Normal heart rate noted  Respiratory: Normal respiratory effort, no problems with respiration noted  Abdomen: Soft, gravid, appropriate for gestational age.  Pain/Pressure: Present     Pelvic: Cervical exam performed Dilation: Fingertip Effacement (%): 60 Station: -3  Extremities: Normal range of motion.  Edema: None  Mental Status:  Normal mood and affect. Normal behavior. Normal judgment and thought content.   Assessment and Plan:  Pregnancy: G2P1001 at [redacted]w[redacted]d  1. History of postpartum depression, currently pregnant Offer care post delivery if  needed  2. History of herpes genitalis Pt using prophylaxis  3. History of gestational hypertension No s/sx of HTN  4. Encounter for supervision of other normal pregnancy, third trimester Continue routine care - Culture, beta strep (group b only) - GC/Chlamydia probe amp ([place])not at [facility]  5. Alpha thalassemia silent carrier   Term labor symptoms and general obstetric precautions including but not limited to vaginal bleeding, contractions, leaking of fluid and fetal movement were reviewed in detail with the patient.  Please refer to After Visit Summary for other counseling recommendations.   Return in about 1 week (around 01/28/2021) for LOB, in person.   Lynnda Shields, MD Faculty Attending [facility]

## 2021-01-22 LAB — GC/CHLAMYDIA PROBE AMP (~~LOC~~) NOT AT ARMC
Chlamydia: NEGATIVE
Comment: NEGATIVE
Comment: NORMAL
Neisseria Gonorrhea: NEGATIVE

## 2021-01-25 LAB — CULTURE, BETA STREP (GROUP B ONLY): Strep Gp B Culture: NEGATIVE

## 2021-01-29 ENCOUNTER — Ambulatory Visit (INDEPENDENT_AMBULATORY_CARE_PROVIDER_SITE_OTHER): Payer: Medicaid Other | Admitting: Nurse Practitioner

## 2021-01-29 ENCOUNTER — Other Ambulatory Visit: Payer: Self-pay

## 2021-01-29 VITALS — BP 127/83 | HR 104 | Wt 169.2 lb

## 2021-01-29 DIAGNOSIS — Z3483 Encounter for supervision of other normal pregnancy, third trimester: Secondary | ICD-10-CM

## 2021-01-29 DIAGNOSIS — Z8659 Personal history of other mental and behavioral disorders: Secondary | ICD-10-CM

## 2021-01-29 DIAGNOSIS — Z8619 Personal history of other infectious and parasitic diseases: Secondary | ICD-10-CM

## 2021-01-29 DIAGNOSIS — Z3A38 38 weeks gestation of pregnancy: Secondary | ICD-10-CM

## 2021-01-29 DIAGNOSIS — O99891 Other specified diseases and conditions complicating pregnancy: Secondary | ICD-10-CM

## 2021-01-29 DIAGNOSIS — Z8759 Personal history of other complications of pregnancy, childbirth and the puerperium: Secondary | ICD-10-CM

## 2021-01-29 NOTE — Patient Instructions (Signed)
° ° °BRAINSTORMING ° °Develop a Plan °Goals: °Provide a way to start conversation about your new life with a baby °Assist parents in recognizing and using resources within their reach °Help pave the way before birth for an easier period of transition afterwards. ° °Make a list of the following information to keep in a central location: °Full name of Mom and Partner: _____________________________________________ °Baby's full name and Date of Birth: ___________________________________________ °Home Address: ___________________________________________________________ °________________________________________________________________________ °Home Phone: ____________________________________________________________ °Parents' cell numbers: _____________________________________________________ °________________________________________________________________________ °Name and contact info for OB: ______________________________________________ °Name and contact info for Pediatrician:________________________________________ °Contact info for Lactation Consultants: ________________________________________ ° °REST and SLEEP °*You each need at least 4-5 hours of uninterrupted sleep every day. Write specific names and contact information.* °How are you going to rest in the postpartum period? While partner's home? When partner returns to work? When you both return to work? °Where will your baby sleep? °Who is available to help during the day? Evening? Night? °Who could move in for a period to help support you? °What are some ideas to help you get enough sleep? °__________________________________________________________________________________________________________________________________________________________________________________________________________________________________________ °NUTRITIOUS FOOD AND DRINK °*Plan for meals before your baby is born so you can have healthy food to eat during the immediate postpartum  period.* °Who will look after breakfast? Lunch? Dinner? List names and contact information. Brainstorm quick, healthy ideas for each meal. °What can you do before baby is born to prepare meals for the postpartum period? °How can others help you with meals? °Which grocery stores provide online shopping and delivery? °Which restaurants offer take-out or delivery options? °______________________________________________________________________________________________________________________________________________________________________________________________________________________________________________________________________________________________________________________________________________________________________________________________________ ° °CARE FOR MOM °*It's important that mom is cared for and pampered in the postpartum period. Remember, the most important ways new mothers need care are: sleep, nutrition, gentle exercise, and time off.* °Who can come take care of mom during this period? Make a list of people with their contact information. °List some activities that make you feel cared for, rested, and energized? Who can make sure you have opportunities to do these things? °Does mom have a space of her very own within your home that's just for her? Make a “Mama Cave” where she can be comfortable, rest, and renew herself daily. °______________________________________________________________________________________________________________________________________________________________________________________________________________________________________________________________________________________________________________________________________________________________________________________________________ ° ° ° °CARE FOR AND FEEDING BABY °*Knowledgeable and encouraging people will offer the best support with regard to feeding your baby.* °Educate yourself and choose the best feeding option  for your baby. °Make a list of people who will guide, support, and be a resource for you as your care for and feed your baby. (Friends that have breastfed or are currently breastfeeding, lactation consultants, breastfeeding support groups, etc.) °Consider a postpartum doula. (These websites can give you information: dona.org & padanc.org) °Seek out local breastfeeding resources like the breastfeeding support group at [facility] or [employer]. °______________________________________________________________________________________________________________________________________________________________________________________________________________________________________________________________________________________________________________________________________________________________________________________________________ ° °CHORES AND ERRANDS °Who can help with a thorough cleaning before baby is born? °Make a list of people who will help with housekeeping and chores, like laundry, light cleaning, dishes, bathrooms, etc. °Who can run some errands for you? °What can you do to make sure you are stocked with basic supplies before baby is born? °Who is going to do the shopping? °______________________________________________________________________________________________________________________________________________________________________________________________________________________________________________________________________________________________________________________________________________________________________________________________________ ° ° ° ° °Family Adjustment °*Nurture yourselves…it helps parents be more loving and allows for better bonding with their child.* °What sorts of things do you and partner enjoy doing together? Which activities help you to connect and strengthen your relationship? Make a list of those things. Make a list of people whom you   trust to care for your baby so you  can have some time together as a couple. °What types of things help partner feel connected to Mom? Make a list. °What needs will partner have in order to bond with baby? °Other children? Who will care for them when you go into labor and while you are in the hospital? °Think about what the needs of your older children might be. Who can help you meet those needs? In what ways are you helping them prepare for bringing baby home? List some specific strategies you have for family adjustment. °_______________________________________________________________________________________________________________________________________________________________________________________________________________________________________________________________________________________________________________________________________________ ° °SUPPORT °*Someone who can empathize with experiences normalizes your problems and makes them more bearable.* °Make a list of other friends, neighbors, and/or co-workers you know with infants (and small children, if applicable) with whom you can connect. °Make a list of local or online support groups, mom groups, etc. in which you can be involved. °______________________________________________________________________________________________________________________________________________________________________________________________________________________________________________________________________________________________________________________________________________________________________________________________________ ° °Childcare Plans °Investigate and plan for childcare if mom is returning to work. °Talk about mom's concerns about her transition back to work. °Talk about partner's concerns regarding this transition. ° °Mental Health °*Your mental health is one of the highest priorities for a pregnant or postpartum mom.* °1 in 5 women experience anxiety and/or depression from the time  of conception through the first year after birth. °Postpartum Mood Disorders are the #1 complication of pregnancy and childbirth and the suffering experienced by these mothers is not necessary! These illnesses are temporary and respond well to treatment, which often includes self-care, social support, talk therapy, and medication when needed. °Women experiencing anxiety and depression often say things like: “I'm supposed to be happy…why do I feel so sad?”, “Why can't I snap out of it?”, “I'm having thoughts that scare me.” °There is no need to be embarrassed if you are feeling these symptoms: °Overwhelmed, anxious, angry, sad, guilty, irritable, hopeless, exhausted but can't sleep °You are NOT alone. You are NOT to blame. With help, you WILL be well. °Where can I find help? Medical professionals such as your OB, midwife, gynecologist, family practitioner, primary care provider, pediatrician, or mental health providers; [facility] support groups: Feelings After Birth, Breastfeeding Support Group, Baby and Me Group, and Fit 4 Two exercise classes. °You have permission to ask for help. It will confirm your feelings, validate your experiences, share/learn coping strategies, and gain support and encouragement as you heal. You are important! °BRAINSTORM °Make a list of local resources, including resources for mom and for partner. °Identify support groups. °Identify people to call late at night - include names and contact info. °Talk with partner about perinatal mood and anxiety disorders. °Talk with your OB, midwife, and doula about baby blues and about perinatal mood and anxiety disorders. °Talk with your pediatrician about perinatal mood and anxiety disorders. ° ° °Support & Sanity Savers   °What do you really need? ° °Basics °In preparing for a new baby, many expectant parents spend hours shopping for baby clothes, decorating the nursery, and deciding which car seat to buy. Yet most don't think much about what  the reality of parenting a newborn will be like, and what they need to make it through that. So, here is the advice of experienced parents. We know you'll read this, and think “they're exaggerating, I don't really need that.” Just trust us on these, OK? Plan for all of this, and if it turns out you don't need it, come back and teach us how you did it! ° °Must-Haves (Once baby's survival   needs are met, make sure you attend to your own survival needs!) °Sleep °An average newborn sleeps 16-18 hours per day, over 6-7 sleep periods, rarely more than three hours at a time. It is normal and healthy for a newborn to wake throughout the night... but really hard on parents!! °Naps. Prioritize sleep above any responsibilities like: cleaning house, visiting friends, running errands, etc.  Sleep whenever baby sleeps. If you can't nap, at least have restful times when baby eats. The more rest you get, the more patient you will be, the more emotionally stable, and better at solving problems. ° °Food °You may not have realized it would be difficult to eat when you have a newborn. Yet, when we talk to °countless new parents, they say things like “it may be 2:00 pm when I realize I haven't had breakfast yet.” Or “every time we sit down to dinner, baby needs to eat, and my food gets cold, so I don't bother to eat it.” °Finger food. Before your baby is born, stock up with one months' worth of food that: 1) you can eat with one hand while holding a baby, 2) doesn't need to be prepped, 3) is good hot or cold, 4) doesn't spoil when left out for a few hours, and 5) you like to eat. Think about: nuts, dried fruit, Clif bars, pretzels, jerky, gogurt, baby carrots, apples, bananas, crackers, cheez-n-crackers, string cheese, hot pockets or frozen burritos to microwave, garden burgers and breakfast pastries to put in the toaster, yogurt drinks, etc. °Restaurant Menus. Make lists of your favorite restaurants & menu items. When family/friends  want to help, you can give specific information without much thought. They can either bring you the food or send gift cards for just the right meals. °Freezer Meals.  Take some time to make a few meals to put in the freezer ahead of time.  Easy to freeze meals can be anything such as soup, lasagna, chicken pie, or spaghetti sauce. °Set up a Meal Schedule.  Ask friends and family to sign up to bring you meals during the first few weeks of being home. (It can be passed around at baby showers!) You have no idea how helpful this will be until you are in the throes of parenting.  www.takethemameal.com is a great website to check out. °Emotional Support °Know who to call when you're stressed out. Parenting a newborn is very challenging work. There are times when it totally overwhelms your normal coping abilities. EVERY NEW PARENT NEEDS TO HAVE A PLAN FOR WHO TO CALL WHEN THEY JUST CAN'T COPE ANY MORE. (And it has to be someone other than the baby's other parent!) Before your baby is born, come up with at least one person you can call for support - write their phone number down and post it on the refrigerator. °Anxiety & Sadness. Baby blues are normal after pregnancy; however, there are more severe types of anxiety & sadness which can occur and should not be ignored.  They are always treatable, but you have to take the first step by reaching out for help. [facility] offers a “Mom Talk” group which meets every Tuesday from 10 am - 11 am.  This group is for new moms who need support and connection after their babies are born.  Call [phone].  °Really, Really Helpful (Plan for them! Make sure these happen often!!) °Physical Support with Taking Care of Yourselves °Asking friends and family. Before your baby is born, set up a schedule   of people who can come and visit and help out (or ask a friend to schedule for you). Any time someone says “let me know what I can do to help,” sign them up for a day. When they get  there, their job is not to take care of the baby (that's your job and your joy). Their job is to take care of you!  °Postpartum doulas. If you don't have anyone you can call on for support, look into postpartum doulas:  professionals at helping parents with caring for baby, caring for themselves, getting breastfeeding started, and helping with household tasks. www.padanc.org is a helpful website for learning about doulas in our area. °Peer Support / Parent Groups °Why: One of the greatest ideas for new parents is to be around other new parents. Parent groups give you a chance to share and listen to others who are going through the same season of life, get a sense of what is normal infant development by watching several babies learn and grow, share your stories of triumph and struggles with empathetic ears, and forgive your own mistakes when you realize all parents are learning by trial and error. °Where to find: There are many places you can meet other new parents throughout our community.  [facility] offers the following classes for new moms and their little ones:  Baby and Me (Birth to Crawling) and Breastfeeding Support Group. Go to www.conehealthybaby.com or call 336-832-6682 for more information. °Time for your Relationship °It's easy to get so caught up in meeting baby's immediate needs that it's hard to find time to connect with your partner, and meet the needs of your relationship. It's also easy to forget what “quality time with your partner” actually looks like. If you take your baby on a date, you'd be amazed how much of your couple time is spent feeding the baby, diapering the baby, admiring the baby, and talking about the baby. °Dating: Try to take time for just the two of you. Babysitter tip: Sometimes when moms are breastfeeding a newborn, they find it hard to figure out how to schedule outings around baby's unpredictable feeding schedules. Have the babysitter come for a three hour period. When  she comes over, if baby has just eaten, you can leave right away, and come back in two hours. If baby hasn't fed recently, you start the date at home. Once baby gets hungry and gets a good feeding in, you can head out for the rest of your date time. °Date Nights at Home: If you can't get out, at least set aside one evening a week to prioritize your relationship: whenever baby dozes off or doesn't have any immediate needs, spend a little time focusing on each other. °Potential conflicts: The main relationship conflicts that come up for new parents are: issues related to sexuality, financial stresses, a feeling of an unfair division of household tasks, and conflicts in parenting styles. The more you can work on these issues before baby arrives, the better!  °Fun and Frills (Don't forget these… and don't feel guilty for indulging in them!) °Everyone has something in life that is a fun little treat that they do just for themselves. It may be: reading the morning paper, or going for a daily jog, or having coffee with a friend once a week, or going to a movie on Friday nights, or fine chocolates, or bubble baths, or curling up with a good book. °Unless you do fun things for yourself every now and then,   it's hard to have the energy for fun with your baby. Whatever your “special” treats are, make sure you find a way to continue to indulge in them after your baby is born. These special moments can recharge you, and allow you to return to baby with a new joy ° ° °PERINATAL MOOD DISORDERS: MATERNAL MENTAL HEALTH FROM CONCEPTION THROUGH THE POSTPARTUM PERIOD ° ° °_________________________________________Emergency and Crisis Resources °If you are an imminent risk to self or others, are experiencing intense personal distress, and/or have noticed significant changes in activities of daily living, call:  °911 °[facility]: 336-890-2700 ° [address], [place], [place], [address] °Mobile Crisis:  877-626-1772 °National Suicide Hotline: 988 °Or visit the following crisis centers: °Local Emergency Departments °[facility]: [address], [place]  °336-676-6840. Hours: 8:30AM-5PM. Insurance Accepted: Medicaid, Medicare, and Uninsured.  °[facility]:  [address], [place]  °Mon-Friday 8am-3pm, [phone]   ° °                                                                              ___________ Non-Crisis Resources °To identify specific providers that are covered by your insurance, contact your insurance company or local agencies:  °[facility]: 1-800-256-2452 °[facility]: 888-581-9988 °[facility]: 1-800-939-5911 °Postpartum Support International- Warm-line: 1-800-944-4773  ° °                                                   __Outpatient Therapy and Medication Management  ° °Providers:  °[facility]: 336-292-1510 °Hours: 9AM-5PM  Insurance Accepted: AARP, Aetna, BCBS, Cigna, [employer], Humana, Medicare  °[facility]): [phone] °Hours: 8AM-5:30PM  nsurance Accepted: All insurances EXCEPT AARP, Aetna, [employer], and Humana °[facility]: [phone] °Hours: 8AM-8PM Insurance Accepted: Aetna, BCBS, Cigna, [employer], Medicaid, Medicare, Uninsured °[facility]: [phone]- [phone] °[facility]: [phone] °Hours: 8:30AM-7PM Insurance Accepted: Aetna, BCBS, Medicaid, Medicare, Tricare, United Healthcare °[facility]:  [phone]  ° Hours:9AM-5PM Insurance Accepted:  Aetna, BCBS, [facility], Medicaid, United Health Care  °[facility]: [phone] °Hours: 9AM-5:30PM Insurance Accepted: AARP, Aetna, BCBS, Cigna, and Medicaid, Medicare, United Health Care °[facility]:  [phone] °Hours: 9am-5pm Insurance Accepted: BCBS; they do not accept Medicaid/Medicare °[facility]: [phone] °Hours: 9am-9pm Insurance Accepted: All major insurance including Medicaid and Medicare °[facility]: [phone] °Hours: 9AM- 5PM Insurance Accepted: All insurances EXCEPT Medicaid and Medicare. °[facility]: [phone] ° ° °____________                                                                     Parenting Support Groups °[facility]: [phone] °[facility]:  [phone]- [phone] °Family Support Network: (support for children in the NICU   and/or with special needs), 336-832-6507 ° ° °___________                                                                 Mental Health Support Groups °[facility]: 336-373-1402 °  ° °_____________                                                                                  Online Resources °Postpartum Support International: http://www.postpartum.net/  800-944-4PPD °2M[employer] Supporting Moms:  www.momssupportingmoms.net ° °  °

## 2021-01-29 NOTE — Progress Notes (Signed)
    Subjective:  Sally Crosby is a 30 y.o. G2P1001 at [redacted]w[redacted]d being seen today for ongoing prenatal care.  She is currently monitored for the following issues for this low-risk pregnancy and has History of herpes genitalis; Alpha thalassemia silent carrier; H/O eczema; Type 3a perineal laceration; History of gestational hypertension; Anxiety; Encounter for supervision of other normal pregnancy, third trimester; Short interval between pregnancies affecting pregnancy in second trimester, antepartum; and History of postpartum depression, currently pregnant on their problem list.  Patient reports occasional contractions.  Contractions: Not present. Vag. Bleeding: None.  Movement: Present. Denies leaking of fluid.   The following portions of the patient's history were reviewed and updated as appropriate: allergies, current medications, past family history, past medical history, past social history, past surgical history and problem list. Problem list updated.  Objective:   Vitals:   01/29/21 0957  BP: 127/83  Pulse: (!) 104  Weight: 169 lb 3.2 oz (76.7 kg)    Fetal Status: Fetal Heart Rate (bpm): 140 Fundal Height: 37 cm Movement: Present     General:  Alert, oriented and cooperative. Patient is in no acute distress.  Skin: Skin is warm and dry. No rash noted.   Cardiovascular: Normal heart rate noted  Respiratory: Normal respiratory effort, no problems with respiration noted  Abdomen: Soft, gravid, appropriate for gestational age. Pain/Pressure: Absent     Pelvic:  Cervical exam deferred        Extremities: Normal range of motion.  Edema: None  Mental Status: Normal mood and affect. Normal behavior. Normal judgment and thought content.   Urinalysis:      Assessment and Plan:  Pregnancy: G2P1001 at [redacted]w[redacted]d  1. Encounter for supervision of other normal pregnancy, third trimester Baby moving well BP normal, denies any headaches  2. History of postpartum depression, currently  pregnant See AVS for resources  3. History of herpes genitalis Taking suppression  4. History of gestational hypertension  Term labor symptoms and general obstetric precautions including but not limited to vaginal bleeding, contractions, leaking of fluid and fetal movement were reviewed in detail with the patient. Please refer to After Visit Summary for other counseling recommendations.  Return in about 1 week (around 02/05/2021) for in person ROB.  Earlie Server, RN, MSN, NP-BC Nurse Practitioner, [facility] for [employer], [place] [facility] 01/29/2021 10:08 AM

## 2021-01-31 ENCOUNTER — Other Ambulatory Visit: Payer: Self-pay | Admitting: Obstetrics and Gynecology

## 2021-02-04 ENCOUNTER — Encounter: Payer: Self-pay | Admitting: Nurse Practitioner

## 2021-02-04 ENCOUNTER — Other Ambulatory Visit: Payer: Self-pay

## 2021-02-04 ENCOUNTER — Ambulatory Visit (INDEPENDENT_AMBULATORY_CARE_PROVIDER_SITE_OTHER): Payer: Medicaid Other | Admitting: Nurse Practitioner

## 2021-02-04 VITALS — BP 137/79 | HR 93 | Wt 170.2 lb

## 2021-02-04 DIAGNOSIS — Z8619 Personal history of other infectious and parasitic diseases: Secondary | ICD-10-CM

## 2021-02-04 DIAGNOSIS — Z3A39 39 weeks gestation of pregnancy: Secondary | ICD-10-CM

## 2021-02-04 DIAGNOSIS — Z8759 Personal history of other complications of pregnancy, childbirth and the puerperium: Secondary | ICD-10-CM

## 2021-02-04 DIAGNOSIS — Z3483 Encounter for supervision of other normal pregnancy, third trimester: Secondary | ICD-10-CM

## 2021-02-04 NOTE — Addendum Note (Signed)
Addended by: Virginia Rochester on: 02/04/2021 04:58 PM   Modules accepted: Orders, SmartSet

## 2021-02-04 NOTE — Patient Instructions (Signed)
[place] for going into labor

## 2021-02-04 NOTE — Progress Notes (Signed)
    Subjective:  Sally Crosby is a 30 y.o. G2P1001 at [redacted]w[redacted]d being seen today for ongoing prenatal care.  She is currently monitored for the following issues for this low-risk pregnancy and has History of herpes genitalis; Alpha thalassemia silent carrier; H/O eczema; Type 3a perineal laceration; History of gestational hypertension; Anxiety; Encounter for supervision of other normal pregnancy, third trimester; Short interval between pregnancies affecting pregnancy in second trimester, antepartum; and History of postpartum depression, currently pregnant on their problem list.  Patient reports occasional contractions.  Contractions: Irritability. Vag. Bleeding: Bloody Show, Scant.  Movement: Present. Denies leaking of fluid.   The following portions of the patient's history were reviewed and updated as appropriate: allergies, current medications, past family history, past medical history, past social history, past surgical history and problem list. Problem list updated.  Objective:   Vitals:   02/04/21 0923  BP: 137/79  Pulse: 93  Weight: 170 lb 3.2 oz (77.2 kg)    Fetal Status: Fetal Heart Rate (bpm): 134 Fundal Height: 39 cm Movement: Present  Presentation: Vertex  General:  Alert, oriented and cooperative. Patient is in no acute distress.  Skin: Skin is warm and dry. No rash noted.   Cardiovascular: Normal heart rate noted  Respiratory: Normal respiratory effort, no problems with respiration noted  Abdomen: Soft, gravid, appropriate for gestational age. Pain/Pressure: Absent     Pelvic:  Cervical exam performed Dilation: 3 Effacement (%): 60 Station: -2  Extremities: Normal range of motion.  Edema: None  Mental Status: Normal mood and affect. Normal behavior. Normal judgment and thought content.   Urinalysis:      Assessment and Plan:  Pregnancy: G2P1001 at [redacted]w[redacted]d  1. Encounter for supervision of other normal pregnancy, third trimester Doing well Reviewed [place] for her to  consider if desired. Desires induction only at 41 weeks.  2. History of gestational hypertension Normal BP today  3. History of herpes genitalis On suppression  Term labor symptoms and general obstetric precautions including but not limited to vaginal bleeding, contractions, leaking of fluid and fetal movement were reviewed in detail with the patient. Please refer to After Visit Summary for other counseling recommendations.  Has appointment scheduled in one week already.  Earlie Server, RN, MSN, NP-BC Nurse Practitioner, [facility] for [employer], [place] [facility] 02/04/2021 12:45 PM

## 2021-02-06 ENCOUNTER — Other Ambulatory Visit: Payer: Self-pay | Admitting: Advanced Practice Midwife

## 2021-02-07 ENCOUNTER — Encounter (HOSPITAL_COMMUNITY): Payer: Self-pay | Admitting: Family Medicine

## 2021-02-07 ENCOUNTER — Other Ambulatory Visit: Payer: Self-pay

## 2021-02-07 ENCOUNTER — Inpatient Hospital Stay (HOSPITAL_COMMUNITY)
Admission: AD | Admit: 2021-02-07 | Discharge: 2021-02-09 | DRG: 806 | Disposition: A | Discharge status: Home / Self Care | Payer: Medicaid Other | Attending: Family Medicine | Admitting: Family Medicine

## 2021-02-07 DIAGNOSIS — Z20822 Contact with and (suspected) exposure to covid-19: Secondary | ICD-10-CM | POA: Diagnosis present

## 2021-02-07 DIAGNOSIS — D563 Thalassemia minor: Secondary | ICD-10-CM | POA: Diagnosis present

## 2021-02-07 DIAGNOSIS — O9852 Other viral diseases complicating childbirth: Secondary | ICD-10-CM | POA: Diagnosis not present

## 2021-02-07 DIAGNOSIS — A6000 Herpesviral infection of urogenital system, unspecified: Secondary | ICD-10-CM | POA: Diagnosis present

## 2021-02-07 DIAGNOSIS — O9832 Other infections with a predominantly sexual mode of transmission complicating childbirth: Secondary | ICD-10-CM | POA: Diagnosis present

## 2021-02-07 DIAGNOSIS — O26893 Other specified pregnancy related conditions, third trimester: Secondary | ICD-10-CM | POA: Diagnosis present

## 2021-02-07 DIAGNOSIS — O480 Post-term pregnancy: Principal | ICD-10-CM | POA: Diagnosis present

## 2021-02-07 DIAGNOSIS — Z3A39 39 weeks gestation of pregnancy: Secondary | ICD-10-CM

## 2021-02-07 LAB — CBC
HCT: 37.8 % (ref 36.0–46.0)
Hemoglobin: 12.7 g/dL (ref 12.0–15.0)
MCH: 28.8 pg (ref 26.0–34.0)
MCHC: 33.6 g/dL (ref 30.0–36.0)
MCV: 85.7 fL (ref 80.0–100.0)
Platelets: 139 10*3/uL — ABNORMAL LOW (ref 150–400)
RBC: 4.41 MIL/uL (ref 3.87–5.11)
RDW: 15.3 % (ref 11.5–15.5)
WBC: 8.8 10*3/uL (ref 4.0–10.5)
nRBC: 0 % (ref 0.0–0.2)

## 2021-02-07 MED ORDER — ACETAMINOPHEN 325 MG PO TABS: 650.0000 mg | ORAL_TABLET | ORAL | Status: DC | PRN

## 2021-02-07 MED ORDER — OXYTOCIN-SODIUM CHLORIDE 30-0.9 UT/500ML-% IV SOLN
2.5000 [IU]/h | INTRAVENOUS | Status: DC
  Filled 2021-02-07: qty 500

## 2021-02-07 MED ORDER — DIPHENHYDRAMINE HCL 50 MG/ML IJ SOLN: 12.5000 mg | INTRAMUSCULAR | Status: DC | PRN

## 2021-02-07 MED ORDER — OXYCODONE-ACETAMINOPHEN 5-325 MG PO TABS: 2.0000 | ORAL_TABLET | ORAL | Status: DC | PRN

## 2021-02-07 MED ORDER — OXYCODONE-ACETAMINOPHEN 5-325 MG PO TABS: 1.0000 | ORAL_TABLET | ORAL | Status: DC | PRN

## 2021-02-07 MED ORDER — OXYTOCIN BOLUS FROM INFUSION
333.0000 mL | Freq: Once | INTRAVENOUS | Status: AC
  Administered 2021-02-08: 333 mL via INTRAVENOUS

## 2021-02-07 MED ORDER — FLEET ENEMA 7-19 GM/118ML RE ENEM: 1.0000 | ENEMA | RECTAL | Status: DC | PRN

## 2021-02-07 MED ORDER — PHENYLEPHRINE 40 MCG/ML (10ML) SYRINGE FOR IV PUSH (FOR BLOOD PRESSURE SUPPORT): 80.0000 ug | PREFILLED_SYRINGE | INTRAVENOUS | Status: DC | PRN

## 2021-02-07 MED ORDER — TERBUTALINE SULFATE 1 MG/ML IJ SOLN: 0.2500 mg | Freq: Once | INTRAMUSCULAR | Status: DC | PRN

## 2021-02-07 MED ORDER — LIDOCAINE HCL (PF) 1 % IJ SOLN: 30.0000 mL | INTRAMUSCULAR | Status: DC | PRN

## 2021-02-07 MED ORDER — ONDANSETRON HCL 4 MG/2ML IJ SOLN: 4.0000 mg | Freq: Four times a day (QID) | INTRAMUSCULAR | Status: DC | PRN

## 2021-02-07 MED ORDER — FENTANYL-BUPIVACAINE-NACL 0.5-0.125-0.9 MG/250ML-% EP SOLN
12.0000 mL/h | EPIDURAL | Status: DC | PRN
  Administered 2021-02-08: 12 mL/h via EPIDURAL

## 2021-02-07 MED ORDER — LACTATED RINGERS IV SOLN: 500.0000 mL | INTRAVENOUS | Status: DC | PRN

## 2021-02-07 MED ORDER — LACTATED RINGERS IV SOLN: INTRAVENOUS | Status: DC

## 2021-02-07 MED ORDER — EPHEDRINE 5 MG/ML INJ: 10.0000 mg | INTRAVENOUS | Status: DC | PRN

## 2021-02-07 MED ORDER — OXYTOCIN-SODIUM CHLORIDE 30-0.9 UT/500ML-% IV SOLN: 1.0000 m[IU]/min | INTRAVENOUS | Status: DC

## 2021-02-07 MED ORDER — SOD CITRATE-CITRIC ACID 500-334 MG/5ML PO SOLN
30.0000 mL | ORAL | Status: DC | PRN
  Administered 2021-02-08: 30 mL via ORAL
  Filled 2021-02-07: qty 30

## 2021-02-07 MED ORDER — LACTATED RINGERS IV SOLN: 500.0000 mL | Freq: Once | INTRAVENOUS | Status: DC

## 2021-02-07 MED ORDER — PHENYLEPHRINE 40 MCG/ML (10ML) SYRINGE FOR IV PUSH (FOR BLOOD PRESSURE SUPPORT)
80.0000 ug | PREFILLED_SYRINGE | INTRAVENOUS | Status: DC | PRN
  Filled 2021-02-07: qty 10

## 2021-02-07 NOTE — MAU Note (Addendum)
Sally Crosby is a 30 y.o. at [redacted]w[redacted]d here in MAU reporting: of contraction. Pt was dilated 8/100/-2. No leaking of fluid, abnormal discharge or bleeding. Pt was rushed up to L/D, after speaking with provider and receiving orders.   Onset of complaint: 02/07/2021 Pain score: 10/10  Lab orders placed from triage:  Covid, UA

## 2021-02-07 NOTE — Discharge Summary (Signed)
Postpartum Discharge Summary    Patient Name: Sally Crosby DOB: 09-22-1990 MRN: [id number]  Date of admission: 02/07/2021 Delivery date:02/08/2021  Delivering provider: Araceli Bouche  Date of discharge: 02/09/2021  Admitting diagnosis: Post-dates pregnancy [O48.0] Intrauterine pregnancy: [redacted]w[redacted]d    Secondary diagnosis:  Active Problems:   Post-dates pregnancy  Additional problems: n/a    Discharge diagnosis: Term Pregnancy Delivered                                              Post partum procedures: n/a Augmentation: AROM Complications: None  Hospital course: Onset of Labor With Vaginal Delivery      30y.o. yo G2P1001 at 31w4das admitted in Active Labor on 02/07/2021. Patient had an uncomplicated labor course as follows:  Membrane Rupture Time/Date: 1:15 AM ,02/08/2021   Delivery Method:Vaginal, Spontaneous  Episiotomy: None  Lacerations:  None  Patient had an uncomplicated postpartum course.  She is ambulating, tolerating a regular diet, passing flatus, and urinating well. Patient is discharged home in stable condition on 02/09/21.  Newborn Data: Birth date:02/08/2021  Birth time:3:34 AM  Gender:Female  Living status:Living  Apgars:9 ,9  Weight:3374 g   Magnesium Sulfate received: No BMZ received: No Rhophylac:N/A MMR:N/A T-DaP:Given prenatally Flu: Yes Transfusion:No  Physical exam  Vitals:   02/08/21 0650 02/08/21 1457 02/08/21 2208 02/09/21 0442  BP: 126/81 130/84 116/83 125/77  Pulse: 70 88 66 67  Resp: _0 Temp: 98.6 F (37 C) 98.5 F (36.9 C) 98.3 F (36.8 C) 98.1 F (36.7 C)  TempSrc: Axillary  Oral Oral  SpO2: 99% 100%  99%   General: alert, cooperative, and no distress Lochia: appropriate Uterine Fundus: firm Incision: N/A DVT Evaluation: No evidence of DVT seen on physical exam. Labs: Lab Results  Component Value Date   WBC 8.8 02/07/2021   HGB 12.7 02/07/2021   HCT 37.8 02/07/2021   MCV 85.7 02/07/2021   PLT 139 (L)  02/07/2021   CMP Latest Ref Rng & Units 09/17/2020  Glucose 65 - 99 mg/dL 82  BUN 6 - 20 mg/dL -  Creatinine 0.57 - 1.00 mg/dL -  Sodium 134 - 144 mmol/L -  Potassium 3.5 - 5.2 mmol/L -  Chloride 96 - 106 mmol/L -  CO2 20 - 29 mmol/L -  Calcium 8.7 - 10.2 mg/dL -  Total Protein 6.0 - 8.5 g/dL -  Total Bilirubin 0.0 - 1.2 mg/dL -  Alkaline Phos 44 - 121 IU/L -  AST 0 - 40 IU/L -  ALT 0 - 32 IU/L -   Edinburgh Score: Edinburgh Postnatal Depression Scale Screening Tool 02/08/2021  I have been able to laugh and see the funny side of things. (No Data)  I have looked forward with enjoyment to things. -  I have blamed myself unnecessarily when things went wrong. -  I have been anxious or worried for no good reason. -  I have felt scared or panicky for no good reason. -  Things have been getting on top of me. -  I have been so unhappy that I have had difficulty sleeping. -  I have felt sad or miserable. -  I have been so unhappy that I have been crying. -  The thought of harming myself has occurred to me. - Flavia Shipperostnatal Depression Scale Total -  After visit meds:  Allergies as of 02/09/2021   No Known Allergies      Medication List     STOP taking these medications    Blood Pressure Kit Devi   valACYclovir 500 MG tablet Commonly known as: Valtrex       TAKE these medications    acetaminophen 325 MG tablet Commonly known as: Tylenol Take 2 tablets (650 mg total) by mouth every 4 (four) hours as needed (for pain scale < 4).   aspirin EC 81 MG tablet Take 1 tablet (81 mg total) by mouth daily. Swallow whole.   ibuprofen 600 MG tablet Commonly known as: ADVIL Take 1 tablet (600 mg total) by mouth every 6 (six) hours.   Prenatal Vitamin 27-0.8 MG Tabs Take 1 tablet by mouth daily.         Discharge home in stable condition Infant Feeding: Breast Infant Disposition:home with mother Discharge instruction: per After Visit Summary and Postpartum  booklet. Activity: Advance as tolerated. Pelvic rest for 6 weeks.  Diet: routine diet Future Appointments: Future Appointments  Date Time Provider [place]  03/11/2021 10:35 AM Renard Matter, MD [facility]   Follow up Visit:  [place] [facility] [place] [facility] Follow up.   Specialty: Obstetrics and Gynecology Why: In 4 weeks for a postpartum appt Contact information: [place] [id number] [phone]                 Please schedule this patient for a Virtual postpartum visit in 4 weeks with the following provider: Any provider. Additional Postpartum F/U:  Low risk pregnancy complicated by:  Delivery mode:  Vaginal, Spontaneous  Anticipated Birth Control:  Condoms   02/09/2021 Wende Mott, CNM

## 2021-02-08 ENCOUNTER — Encounter (HOSPITAL_COMMUNITY): Payer: Self-pay | Admitting: Family Medicine

## 2021-02-08 ENCOUNTER — Inpatient Hospital Stay (HOSPITAL_COMMUNITY): Payer: Medicaid Other | Admitting: Anesthesiology

## 2021-02-08 DIAGNOSIS — O9852 Other viral diseases complicating childbirth: Secondary | ICD-10-CM

## 2021-02-08 DIAGNOSIS — Z3A39 39 weeks gestation of pregnancy: Secondary | ICD-10-CM

## 2021-02-08 LAB — RPR: RPR Ser Ql: NONREACTIVE

## 2021-02-08 LAB — RESP PANEL BY RT-PCR (FLU A&B, COVID) ARPGX2
Influenza A by PCR: NEGATIVE
Influenza B by PCR: NEGATIVE
SARS Coronavirus 2 by RT PCR: NEGATIVE

## 2021-02-08 LAB — TYPE AND SCREEN
ABO/RH(D): A POS
Antibody Screen: NEGATIVE

## 2021-02-08 MED ORDER — BENZOCAINE-MENTHOL 20-0.5 % EX AERO: 1.0000 "application " | INHALATION_SPRAY | CUTANEOUS | Status: DC | PRN

## 2021-02-08 MED ORDER — METHYLERGONOVINE MALEATE 0.2 MG PO TABS: 0.2000 mg | ORAL_TABLET | ORAL | Status: DC | PRN

## 2021-02-08 MED ORDER — MEDROXYPROGESTERONE ACETATE 150 MG/ML IM SUSP: 150.0000 mg | INTRAMUSCULAR | Status: DC | PRN

## 2021-02-08 MED ORDER — ONDANSETRON HCL 4 MG PO TABS: 4.0000 mg | ORAL_TABLET | ORAL | Status: DC | PRN

## 2021-02-08 MED ORDER — EPHEDRINE 5 MG/ML INJ: 10.0000 mg | INTRAVENOUS | Status: DC | PRN

## 2021-02-08 MED ORDER — LACTATED RINGERS IV SOLN: 500.0000 mL | Freq: Once | INTRAVENOUS | Status: DC

## 2021-02-08 MED ORDER — LIDOCAINE-EPINEPHRINE (PF) 2 %-1:200000 IJ SOLN
INTRAMUSCULAR | Status: DC | PRN
  Administered 2021-02-08: 5 mL via EPIDURAL

## 2021-02-08 MED ORDER — FLEET ENEMA 7-19 GM/118ML RE ENEM: 1.0000 | ENEMA | Freq: Every day | RECTAL | Status: DC | PRN

## 2021-02-08 MED ORDER — SIMETHICONE 80 MG PO CHEW: 80.0000 mg | CHEWABLE_TABLET | ORAL | Status: DC | PRN

## 2021-02-08 MED ORDER — DOCUSATE SODIUM 100 MG PO CAPS
100.0000 mg | ORAL_CAPSULE | Freq: Two times a day (BID) | ORAL | Status: DC
  Administered 2021-02-08 – 2021-02-09 (×2): 100 mg via ORAL
  Filled 2021-02-08 (×2): qty 1

## 2021-02-08 MED ORDER — PRENATAL MULTIVITAMIN CH
1.0000 | ORAL_TABLET | Freq: Every day | ORAL | Status: DC
  Administered 2021-02-08 – 2021-02-09 (×2): 1 via ORAL
  Filled 2021-02-08 (×2): qty 1

## 2021-02-08 MED ORDER — FENTANYL-BUPIVACAINE-NACL 0.5-0.125-0.9 MG/250ML-% EP SOLN
EPIDURAL | Status: AC
  Filled 2021-02-08: qty 250

## 2021-02-08 MED ORDER — MEASLES, MUMPS & RUBELLA VAC IJ SOLR: 0.5000 mL | Freq: Once | INTRAMUSCULAR | Status: DC

## 2021-02-08 MED ORDER — FENTANYL-BUPIVACAINE-NACL 0.5-0.125-0.9 MG/250ML-% EP SOLN: 12.0000 mL/h | EPIDURAL | Status: DC | PRN

## 2021-02-08 MED ORDER — ONDANSETRON HCL 4 MG/2ML IJ SOLN: 4.0000 mg | INTRAMUSCULAR | Status: DC | PRN

## 2021-02-08 MED ORDER — WITCH HAZEL-GLYCERIN EX PADS: 1.0000 "application " | MEDICATED_PAD | CUTANEOUS | Status: DC | PRN

## 2021-02-08 MED ORDER — COCONUT OIL OIL
1.0000 "application " | TOPICAL_OIL | Status: DC | PRN
  Administered 2021-02-08: 1 via TOPICAL

## 2021-02-08 MED ORDER — METHYLERGONOVINE MALEATE 0.2 MG/ML IJ SOLN: 0.2000 mg | INTRAMUSCULAR | Status: DC | PRN

## 2021-02-08 MED ORDER — TETANUS-DIPHTH-ACELL PERTUSSIS 5-2.5-18.5 LF-MCG/0.5 IM SUSY: 0.5000 mL | PREFILLED_SYRINGE | Freq: Once | INTRAMUSCULAR | Status: DC

## 2021-02-08 MED ORDER — DIPHENHYDRAMINE HCL 25 MG PO CAPS: 25.0000 mg | ORAL_CAPSULE | Freq: Four times a day (QID) | ORAL | Status: DC | PRN

## 2021-02-08 MED ORDER — IBUPROFEN 600 MG PO TABS
600.0000 mg | ORAL_TABLET | Freq: Four times a day (QID) | ORAL | Status: DC
  Administered 2021-02-08 – 2021-02-09 (×6): 600 mg via ORAL
  Filled 2021-02-08 (×6): qty 1

## 2021-02-08 MED ORDER — DIBUCAINE (PERIANAL) 1 % EX OINT: 1.0000 "application " | TOPICAL_OINTMENT | CUTANEOUS | Status: DC | PRN

## 2021-02-08 MED ORDER — BISACODYL 10 MG RE SUPP: 10.0000 mg | Freq: Every day | RECTAL | Status: DC | PRN

## 2021-02-08 MED ORDER — FERROUS SULFATE 325 (65 FE) MG PO TABS
325.0000 mg | ORAL_TABLET | ORAL | Status: DC
  Administered 2021-02-08: 325 mg via ORAL
  Filled 2021-02-08: qty 1

## 2021-02-08 MED ORDER — PHENYLEPHRINE 40 MCG/ML (10ML) SYRINGE FOR IV PUSH (FOR BLOOD PRESSURE SUPPORT): 80.0000 ug | PREFILLED_SYRINGE | INTRAVENOUS | Status: DC | PRN

## 2021-02-08 MED ORDER — ACETAMINOPHEN 325 MG PO TABS: 650.0000 mg | ORAL_TABLET | ORAL | Status: DC | PRN

## 2021-02-08 NOTE — Anesthesia Postprocedure Evaluation (Signed)
Anesthesia Post Note  Patient: Sally Crosby  Procedure(s) Performed: AN AD HOC LABOR EPIDURAL     Patient location during evaluation: Mother Baby Anesthesia Type: Epidural Level of consciousness: awake and alert Pain management: pain level controlled Vital Signs Assessment: post-procedure vital signs reviewed and stable Respiratory status: spontaneous breathing, nonlabored ventilation and respiratory function stable Cardiovascular status: stable Postop Assessment: no headache, no backache, epidural receding, no apparent nausea or vomiting, patient able to bend at knees, adequate PO intake and able to ambulate Anesthetic complications: no   No notable events documented.  Last Vitals:  Vitals:   02/08/21 0545 02/08/21 0650  BP: (!) 141/96 126/81  Pulse: (!) 105 70  Resp: 17 17  Temp: 36.9 C 37 C  SpO2: 100% 99%    Last Pain:  Vitals:   02/08/21 0650  TempSrc: Axillary  PainSc: 0-No pain   Pain Goal:                   [employer]

## 2021-02-08 NOTE — Anesthesia Procedure Notes (Signed)
Epidural Patient location during procedure: OB Start time: 02/08/2021 12:14 AM End time: 02/08/2021 12:20 AM  Staffing Anesthesiologist: Effie Berkshire, MD Performed: anesthesiologist   Preanesthetic Checklist Completed: patient identified, IV checked, site marked, risks and benefits discussed, surgical consent, monitors and equipment checked, pre-op evaluation and timeout performed  Epidural Patient position: sitting Prep: DuraPrep Patient monitoring: heart rate, continuous pulse ox and blood pressure Approach: midline Location: L3-L4 Injection technique: LOR saline  Needle:  Needle type: Tuohy  Needle gauge: 17 G Needle length: 9 cm Catheter type: closed end flexible Catheter size: 20 Guage Test dose: negative and 1.5% lidocaine  Assessment Events: blood not aspirated, injection not painful, no injection resistance and no paresthesia  Additional Notes LOR @ 5  Patient identified. Risks/Benefits/Options discussed with patient including but not limited to bleeding, infection, nerve damage, paralysis, failed block, incomplete pain control, headache, blood pressure changes, nausea, vomiting, reactions to medications, itching and postpartum back pain. Confirmed with bedside nurse the patient's most recent platelet count. Confirmed with patient that they are not currently taking any anticoagulation, have any bleeding history or any family history of bleeding disorders. Patient expressed understanding and wished to proceed. All questions were answered. Sterile technique was used throughout the entire procedure. Please see nursing notes for vital signs. Test dose was given through epidural catheter and negative prior to continuing to dose epidural or start infusion. Warning signs of high block given to the patient including shortness of breath, tingling/numbness in hands, complete motor block, or any concerning symptoms with instructions to call for help. Patient was given instructions  on fall risk and not to get out of bed. All questions and concerns addressed with instructions to call with any issues or inadequate analgesia.    Reason for block:procedure for pain

## 2021-02-08 NOTE — H&P (Addendum)
OBSTETRIC ADMISSION HISTORY AND PHYSICAL   Sally Crosby is a 30 y.o. female G2P1001 with IUP at 26w4dby LMP presenting for SOL. She reports +FMs, No LOF, no VB, no blurry vision, headaches or peripheral edema, and RUQ pain.  She plans on breast feeding. She requests condoms for birth control. She [facility] her [facility] --->  Estimated Date of Delivery: 02/10/21   Sono:     @[redacted]w[redacted]d , CWD, normal anatomy, variable presentation, 348g, 81% EFW     Prenatal History/Complications: Hx 3rd degree tear, short interval pregnancy, hx HSV, hx gHTN in previous pregnancy   Past Medical History:     Past Medical History:  Diagnosis Date   Fibroids 02/17/2019   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 adolescence 07/20/2020      Past Surgical History:      Past Surgical History:  Procedure Laterality Date   HERNIA REPAIR   1999      Obstetrical History: OB History       Gravida  2   Para  1   Term  1   Preterm      AB      Living  1        SAB      IAB      Ectopic      Multiple  0   Live Births  1               Social History Social History         Socioeconomic History   Marital status: Single      Spouse name: Not on file   Number of children: Not on file   Years of education: Not on file   Highest education level: Not on file  Occupational History   Not on file  Tobacco Use   Smoking status: Never   Smokeless tobacco: Never  Vaping Use   Vaping Use: Never used  Substance and Sexual Activity   Alcohol use: Not Currently      Comment: occasional   Drug use: Never   Sexual activity: Yes      Birth control/protection: None  Other Topics Concern   Not on file  Social History Narrative   Not on file    Social Determinants of Health       Financial Resource Strain: Not on file  Food Insecurity: No Food Insecurity   Worried About Running Out of Food in the  Last Year: Never true   Ran Out of Food in the Last Year: Never true  Transportation Needs: No Transportation Needs   Lack of Transportation (Medical): No   Lack of Transportation (Non-Medical): No  Physical Activity: Not on file  Stress: Not on file  Social Connections: Not on file      Family History:      Family History  Problem Relation Age of Onset   Hypertension Mother        Allergies: No Known Allergies          Medications Prior to Admission  Medication Sig Dispense Refill Last Dose   acetaminophen (TYLENOL) 325 MG tablet Take 2 tablets (650 mg total) by mouth every 4 (four) hours as needed (for pain scale < 4). (Patient not taking: No sig reported) 30 tablet 0 Unknown   aspirin EC 81 MG tablet Take 1 tablet (81 mg total) by mouth daily.  Swallow whole. 90 tablet 3 Unknown   Blood Pressure Monitoring (BLOOD PRESSURE KIT) DEVI 1 Device by Does not apply route daily. ICD 10: Z34.00 (Patient not taking: No sig reported) 1 Device 0 Unknown   Prenatal Vit-Fe Fumarate-FA (PRENATAL VITAMIN) 27-0.8 MG TABS Take 1 tablet by mouth daily. 90 tablet 2 Unknown   valACYclovir (VALTREX) 500 MG tablet Take 1 tablet (500 mg total) by mouth 2 (two) times daily. 60 tablet 0 Unknown        Review of Systems    All systems reviewed and negative except as stated in HPI   Last menstrual period 05/06/2020, unknown if currently breastfeeding. General appearance: alert, cooperative, and moderate distress with frequent contractions Lungs: normal respiratory effort Heart: regular rate  Abdomen: soft, non-tender; bowel sounds normal Pelvic: pending next cervical check Extremities: Homans sign is negative, no sign of DVT DTR's deferred Presentation: cephalic Fetal monitoringBaseline: 130 bpm, Variability: Good {> 6 bpm), Accelerations: Reactive, and Decelerations: Absent Uterine activityFrequency: Every 2-3 minutes     Prenatal labs: ABO, Rh: A/Positive/-- (04/22 1122) Antibody: Negative  (04/22 1122) Rubella: 2.19 (04/22 1122) RPR: Non Reactive (08/17 0828)  HBsAg: Negative (04/22 1122)  HIV: Non Reactive (08/17 0828)  GBS: Negative/-- (10/24 1028)  GTT: early third trimester negative Genetic screening  NIPS low risk female, AFP negative, Horizon silent carrier alpha thalassemia  Anatomy US normal   Prenatal Transfer Tool  Maternal Diabetes: No Genetic Screening: Normal (alpha thal silent carrier) Maternal Ultrasounds/Referrals: Normal Fetal Ultrasounds or other Referrals:  None Maternal Substance Abuse:  No Significant Maternal Medications:  None Significant Maternal Lab Results: Group B Strep negative   No results found for this or any previous visit (from the past 24 hour(s)).       Patient Active Problem List    Diagnosis Date Noted   Anxiety 07/20/2020   Encounter for supervision of other normal pregnancy, third trimester 07/20/2020   Short interval between pregnancies affecting pregnancy in second trimester, antepartum 07/20/2020   History of postpartum depression, currently pregnant 07/20/2020   History of gestational hypertension 07/11/2019   Type 3a perineal laceration 07/09/2019   H/O eczema 07/06/2019   Alpha thalassemia silent carrier 02/17/2019   History of herpes genitalis 01/06/2019      Assessment/Plan:  Sally Crosby is a 30 y.o. G2P1001 at 57w4dhere for LMP presenting for SOL.   #Labor: Presented in active labor at 8/100/-2. Expectant management. Would consider AROM after epidural placement if needed. #Pain:  Requesting epidural #FWB: Category I #ID:      GBS negative #MOF: breast #MOC: condoms #Circ:   Yes   #Hx hsv: Taking valtrex  #Hx pp depression: Social work consult placed   AAraceli Bouche MD  02/07/2021, 11:12 PM  I personally saw and evaluated the patient, performing the key elements of the service. I developed and verified the management plan that is described in the resident's/student's note, and I agree with the  content with my edits above. VSS, HRR&R, Resp unlabored, Legs neg.  FNigel Berthold CNM 02/08/2021 4:04 AM

## 2021-02-08 NOTE — Progress Notes (Signed)
Labor Progress Note Sally Crosby is a 30 y.o. G2P1001 at [redacted]w[redacted]d presented for SOL.  S: Received epidural. Is now resting comfortably in bed.  O:  BP 110/87   Pulse (!) 120   Resp 17   LMP 05/06/2020   SpO2 100%  EFM: baseline 135/moderate variability/accelerations present, a few early decelerations and a few variable decelerations present  CVE: Dilation: 10 Dilation Complete Date: 02/08/21 Dilation Complete Time: 0115 Effacement (%): 100 Cervical Position: Middle Station: 0 Presentation: Vertex Exam by:: Dr. Corky Sox   A&P: 30 y.o. G2P1001 [redacted]w[redacted]d admitted for SOL. #Labor: Progressing well. Patient requested AROM which was performed with return of large amount of clear fluid. Subsequently patient was complete. Not yet feeling the urge to push. Will allow to labor down. #Pain: Well controlled on epidural. #FWB: Category II, reassuring variability and accelerations. Will monitor and implement positional changes and other interventions as needed. #GBS negative  #Hx hsv: Taking valtrex. No lesions on external genitalia.   #Hx pp depression: Social work consult placed  Araceli Bouche, MD 1:31 AM

## 2021-02-08 NOTE — Anesthesia Preprocedure Evaluation (Signed)
Anesthesia Evaluation  Patient identified by MRN, date of birth, ID band Patient awake    Reviewed: Allergy & Precautions, Patient's Chart, lab work & pertinent test results  Airway Mallampati: I       Dental no notable dental hx.    Pulmonary    Pulmonary exam normal        Cardiovascular negative cardio ROS   Rhythm:Regular Rate:Normal     Neuro/Psych PSYCHIATRIC DISORDERS Anxiety  Neuromuscular disease    GI/Hepatic negative GI ROS, Neg liver ROS,   Endo/Other  negative endocrine ROS  Renal/GU negative Renal ROS     Musculoskeletal   Abdominal   Peds  Hematology   Anesthesia Other Findings   Reproductive/Obstetrics (+) Pregnancy                             Anesthesia Physical Anesthesia Plan  ASA: 2  Anesthesia Plan: Epidural   Post-op Pain Management:    Induction:   PONV Risk Score and Plan: 0  Airway Management Planned: Natural Airway  Additional Equipment: None  Intra-op Plan:   Post-operative Plan:   Informed Consent: I have reviewed the patients History and Physical, chart, labs and discussed the procedure including the risks, benefits and alternatives for the proposed anesthesia with the patient or authorized representative who has indicated his/her understanding and acceptance.       Plan Discussed with:   Anesthesia Plan Comments: (Lab Results      Component                Value               Date                      WBC                      8.8                 02/07/2021                HGB                      12.7                02/07/2021                HCT                      37.8                02/07/2021                MCV                      85.7                02/07/2021                PLT                      139 (L)             02/07/2021           )        Anesthesia Quick Evaluation

## 2021-02-09 MED ORDER — IBUPROFEN 600 MG PO TABS: 600.0000 mg | ORAL_TABLET | Freq: Four times a day (QID) | ORAL | 0 refills | Status: DC

## 2021-02-10 ENCOUNTER — Encounter (HOSPITAL_COMMUNITY): Payer: Medicaid Other

## 2021-02-11 ENCOUNTER — Encounter: Payer: Self-pay | Admitting: Student

## 2021-02-17 ENCOUNTER — Inpatient Hospital Stay (HOSPITAL_COMMUNITY)
Admission: AD | Admit: 2021-02-17 | Payer: Medicaid Other | Source: Home / Self Care | Admitting: Obstetrics & Gynecology

## 2021-02-17 ENCOUNTER — Inpatient Hospital Stay (HOSPITAL_COMMUNITY): Payer: Medicaid Other

## 2021-02-19 ENCOUNTER — Telehealth (HOSPITAL_COMMUNITY): Payer: Self-pay

## 2021-02-19 ENCOUNTER — Encounter: Payer: Self-pay | Admitting: Obstetrics and Gynecology

## 2021-02-19 NOTE — Telephone Encounter (Signed)
No answer. Message left to return nurse call.   Sally Crosby 02/19/21,1859

## 2021-03-11 ENCOUNTER — Telehealth (INDEPENDENT_AMBULATORY_CARE_PROVIDER_SITE_OTHER): Payer: Medicaid Other | Admitting: Family Medicine

## 2021-03-11 DIAGNOSIS — Z3482 Encounter for supervision of other normal pregnancy, second trimester: Secondary | ICD-10-CM

## 2021-03-11 MED ORDER — PRENATAL VITAMIN 27-0.8 MG PO TABS: 1.0000 | ORAL_TABLET | Freq: Every day | ORAL | 2 refills | Status: DC

## 2021-03-11 NOTE — Progress Notes (Signed)
Provider location: [facility]   Patient location: Home  I connected with Linton Flemings on 03/11/21 at 10:35 AM EST by Mychart Video Encounter and verified that I am speaking with the correct person using two identifiers.       I discussed the limitations, risks, security and privacy concerns of performing an evaluation and management service virtually and the availability of in person appointments. I also discussed with the patient that there may be a patient responsible charge related to this service. The patient expressed understanding and agreed to proceed.  Post Partum Visit Note Subjective:   Sally Crosby is a 30 y.o. G39P2002 female who presents for a postpartum visit. She is 4 weeks and 2 days postpartum following a normal spontaneous vaginal delivery.  I have fully reviewed the prenatal and intrapartum course. The delivery was at 39w4 gestational weeks.  Anesthesia: none. Postpartum course has been uncomplicated. Baby is doing well. Baby is feeding by breast. She does feel like sometimes baby is latching better and then other times not as well. Would like to connect with lactation. Bleeding no bleeding. Bowel function is normal. Bladder function is normal. Patient is not sexually active at this time. Contraception method is condoms. Postpartum depression screening: negative.   The pregnancy intention screening data noted above was reviewed. Potential methods of contraception were discussed. The patient elected to proceed with condoms.   Edinburgh Postnatal Depression Scale - 03/11/21 1146       Edinburgh Postnatal Depression Scale:  In the Past 7 Days   I have been able to laugh and see the funny side of things. 0    I have looked forward with enjoyment to things. 0    I have blamed myself unnecessarily when things went wrong. 0    I have been anxious or worried for no good reason. 0    I have felt scared or panicky for no good reason. 0     Things have been getting on top of me. 0    I have been so unhappy that I have had difficulty sleeping. 0    I have felt sad or miserable. 0    I have been so unhappy that I have been crying. 0    The thought of harming myself has occurred to me. 0    Edinburgh Postnatal Depression Scale Total 0             The following portions of the patient's history were reviewed and updated as appropriate: allergies, current medications, past family history, past medical history, past social history, past surgical history, and problem list.  Review of Systems Pertinent items are noted in HPI.  Objective:  BP (!) 135/93   Pulse 71   LMP 05/06/2020   Breastfeeding Yes     General:  Alert, oriented and cooperative. Patient is in no acute distress.  Respiratory: Normal respiratory effort, no problems with respiration noted  Mental Status: Normal mood and affect. Normal behavior. Normal judgment and thought content.  Rest of physical exam deferred due to type of encounter   Assessment:  Patient meeting post partum milestones. MyChart appointment done. No vitals in office. At home BP at 135/93 without any symptoms at this time. Recommended continuing to check every other day and also if having headaches/neurological symptoms.     Plan:  Essential components of care per ACOG recommendations:  1.  Mood and well being: Patient with negative depression screening today.  Reviewed local resources for support.  - Patient does not use tobacco.  - hx of drug use? No   If yes, discussed support systems  2. Infant care and feeding:  -Patient currently breastmilk feeding? Yes. If needed, patient was provided letter for work to allow for every 2-3 hr pumping breaks, and to be granted a private location to express breastmilk and refrigerated area to store breastmilk. Reviewed importance of draining breast regularly to support lactation. -Social determinants of health (SDOH) reviewed in EPIC. No  concerns were identified  3. Sexuality, contraception and birth spacing - Patient does not want a pregnancy in the next year.  - Reviewed forms of contraception in tiered fashion. Patient desired condoms today.   - Discussed birth spacing of 18 months  4. Sleep and fatigue -Encouraged family/partner/community support of 4 hrs of uninterrupted sleep to help with mood and fatigue  5. Physical Recovery  - Discussed patients delivery and complications - Patient did not have a laceration, perineal healing reviewed. Patient expressed understanding - Patient has urinary incontinence? No - Patient is safe to resume physical and sexual activity  6.  Health Maintenance - Last pap smear done 09/2019 and was normal with negative HPV.  7. Chronic Disease - PCP follow up for HTN    Renard Matter, MD [facility], [place]

## 2021-03-11 NOTE — Progress Notes (Signed)
Attempted to call patient at approximately 10:45 AM for mychart virtual visit scheduled for 10:35 AM. Patient did not answer, voicemail left asking patient to log into mychart for virtual visit or to call the office back. At approximately 11 AM a second call was placed to patient with no answer.   Paulina Fusi, RN  03/11/21

## 2021-08-07 IMAGING — US US MFM OB COMP +14 WKS
1 series · 12 of 28 positions shown · non-contrast
Comparison: none

[Series 1: us mfm ob comp +14 wks · 12 of 139 slices shown]
[im 6/139]
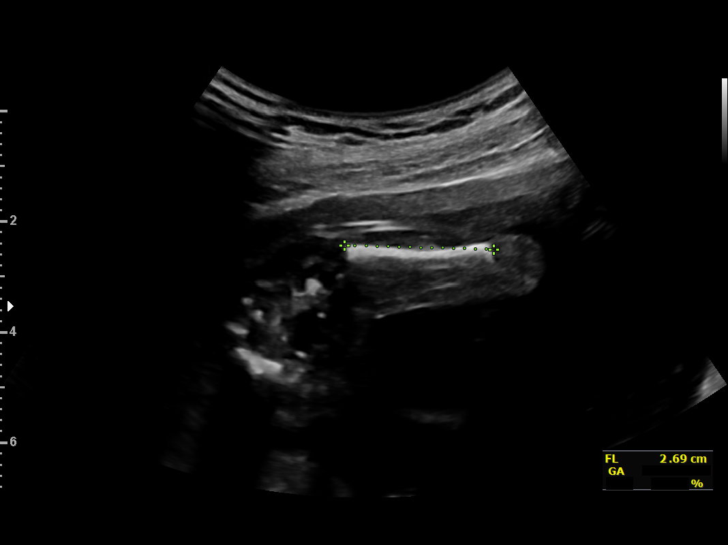
[im 16/139]
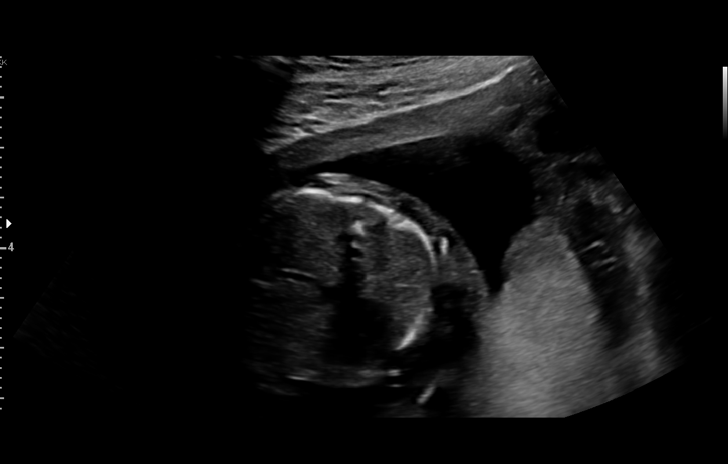
[im 26/139]
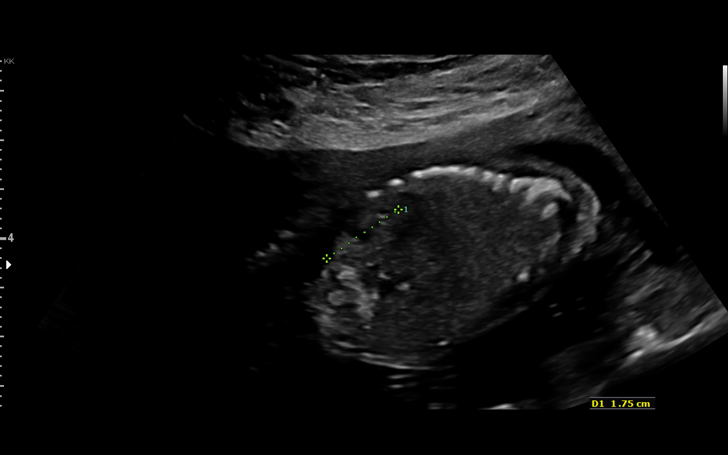
[im 41/139]
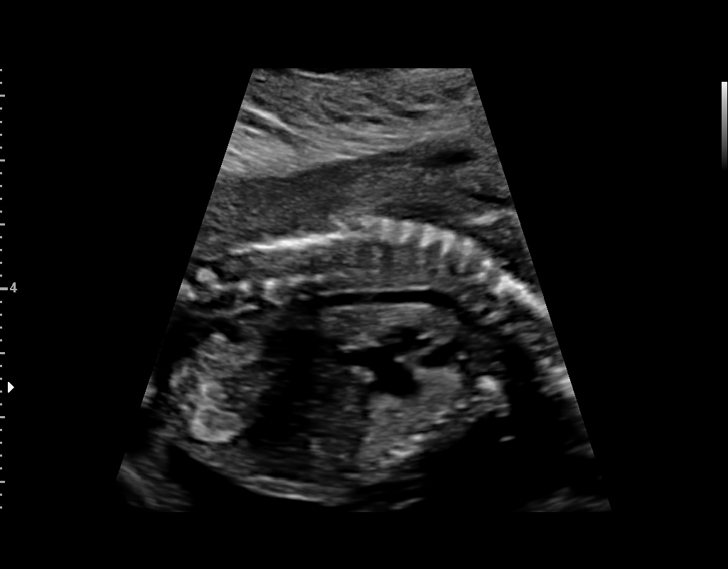
[im 52/139]
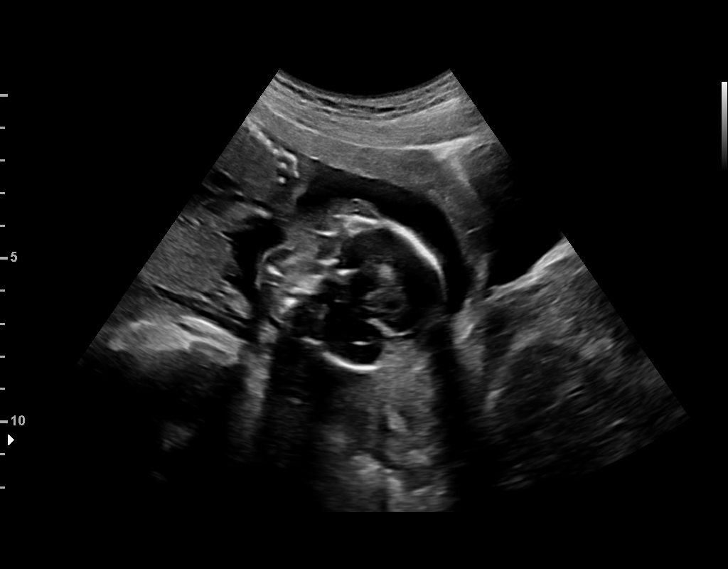
[im 62/139]
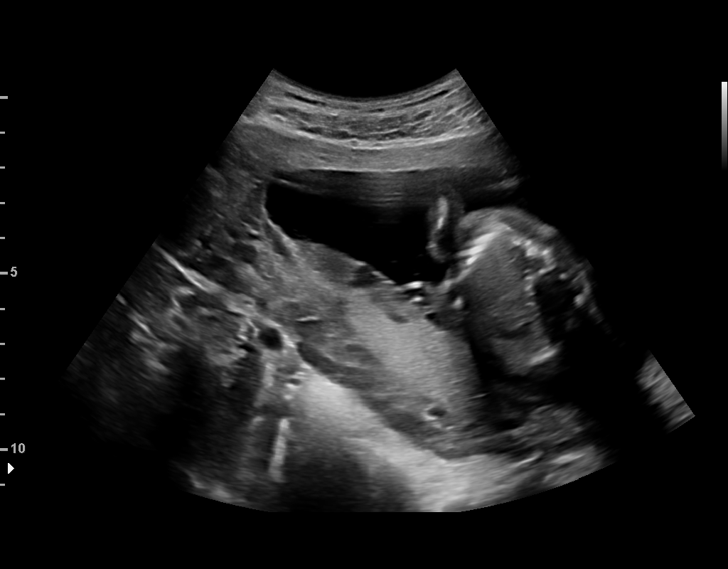
[im 77/139]
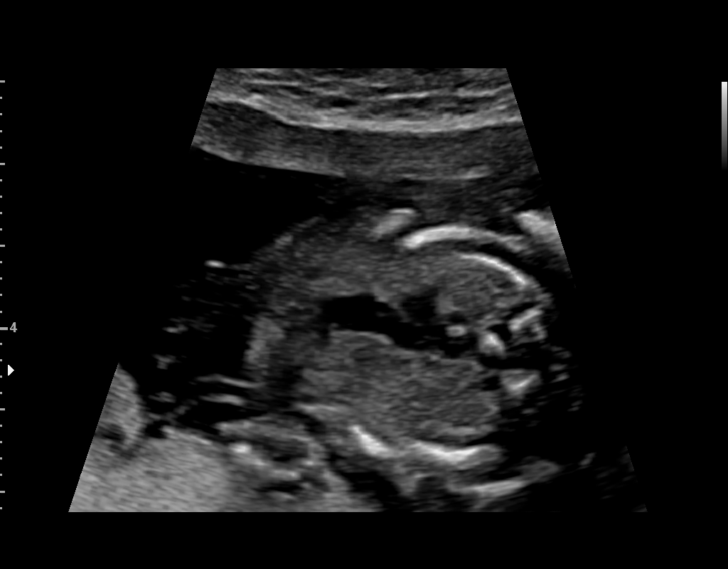
[im 87/139]
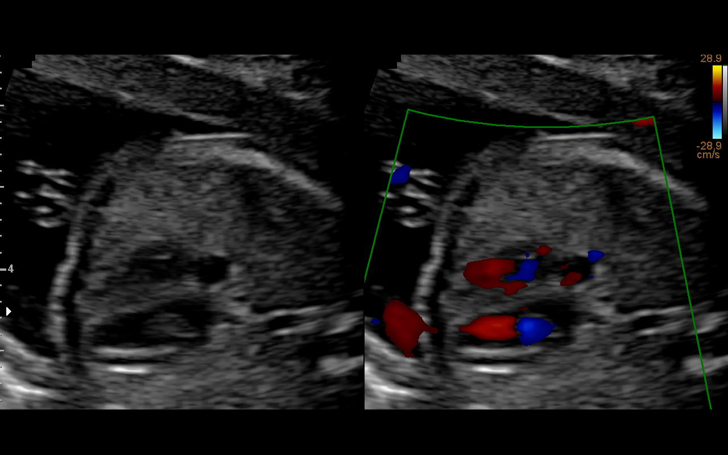
[im 98/139]
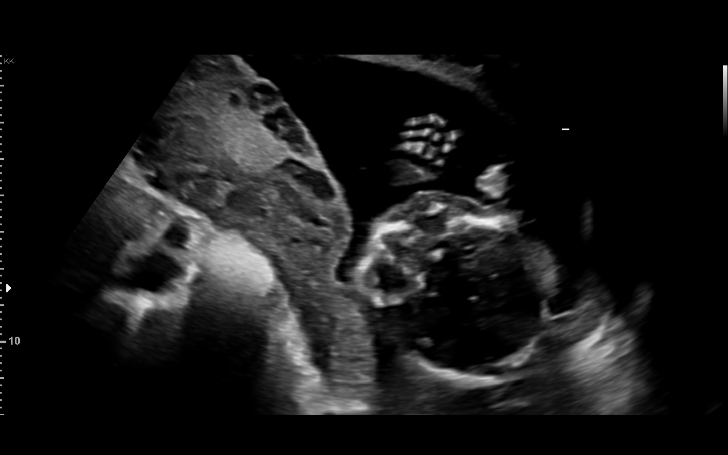
[im 113/139]
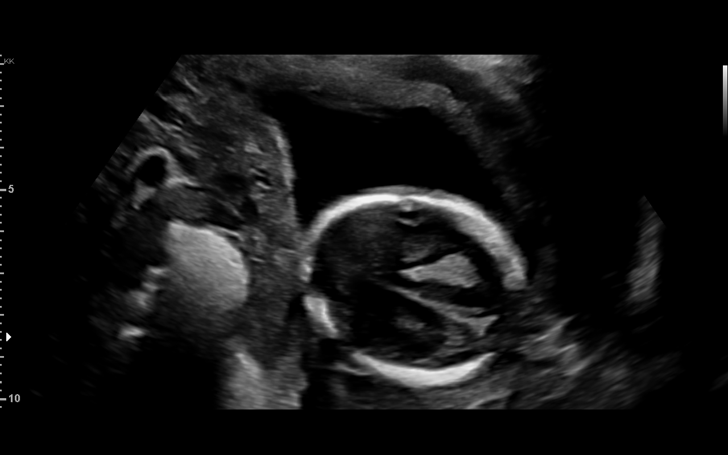
[im 123/139]
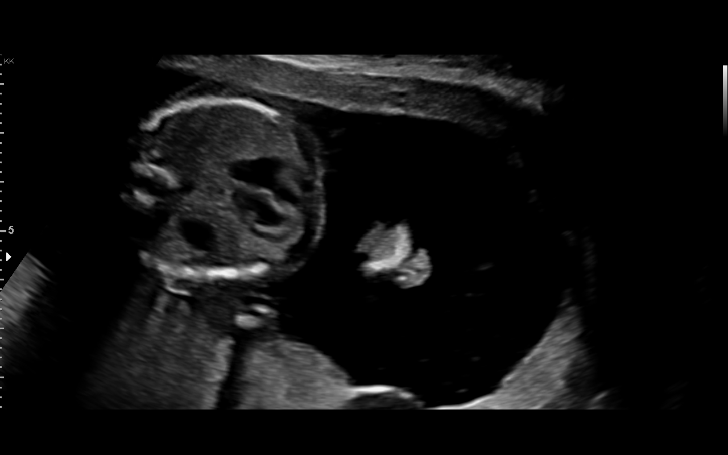
[im 133/139]
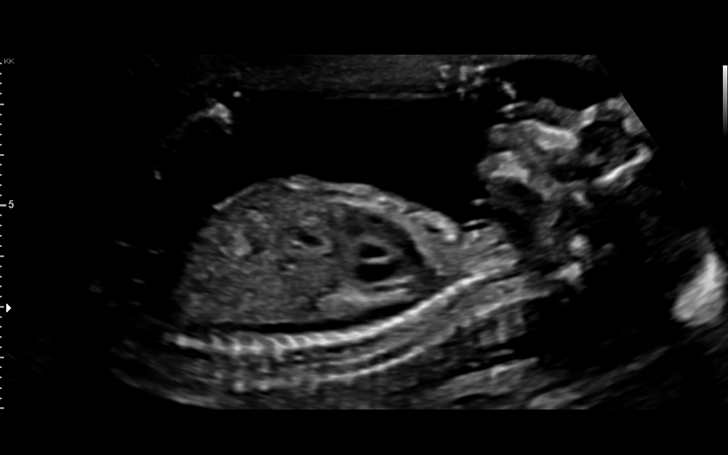

[12 of 28 positions shown; findings below may reference images not displayed]

IMBRASAITE NP

 ----------------------------------------------------------------------

 ----------------------------------------------------------------------
Indications

  Encounter for antenatal screening for
  malformations
  Negative AFP (Low Risk NIPS)
  19 weeks gestation of pregnancy
 ----------------------------------------------------------------------
Vital Signs

 BMI:
Fetal Evaluation

 Num Of Fetuses:          1
 Fetal Heart Rate(bpm):   153
 Cardiac Activity:        Observed
 Presentation:            Cephalic
 Placenta:                Posterior
 P. Cord Insertion:       Not well visualized

 Amniotic Fluid
 AFI FV:      Within normal limits

                             Largest Pocket(cm)

Biometry

 BPD:      43.1  mm     G. Age:  19w 0d         52  %    CI:        74.91   %    70 - 86
                                                         FL/HC:       18.4  %    16.1 -
 HC:       158   mm     G. Age:  18w 5d         27  %    HC/AC:       1.13       1.09 -
 AC:      139.5  mm     G. Age:  19w 2d         58  %    FL/BPD:      67.3  %
 FL:         29  mm     G. Age:  18w 6d         40  %    FL/AC:       20.8  %    20 - 24
 HUM:      26.9  mm     G. Age:  18w 4d         38  %
 NFT:       4.4  mm

 Est. FW:     274   gm   0 lb 10 oz      52  %
OB History

 Gravidity:    1
Gestational Age

 LMP:           19w 0d        Date:  09/28/18                 EDD:   07/05/19
 U/S Today:     19w 0d                                        EDD:   07/05/19
 Best:          19w 0d     Det. By:  LMP  (09/28/18)          EDD:   07/05/19
Anatomy

 Cranium:               Appears normal         LVOT:                   Appears normal
 Cavum:                 Not well visualized    Aortic Arch:            Appears normal
 Ventricles:            Appears normal         Ductal Arch:            Appears normal
 Choroid Plexus:        Appears normal         Diaphragm:              Appears normal
 Cerebellum:            Appears normal         Stomach:                Appears normal, left
                                                                       sided
 Posterior Fossa:       Appears normal         Abdomen:                Appears normal
 Nuchal Fold:           Appears normal         Abdominal Wall:         Appears nml (cord
                                                                       insert, abd wall)
 Face:                  Appears normal         Cord Vessels:           Appears normal (3
                        (orbits and profile)                           vessel cord)
 Lips:                  Not well visualized    Kidneys:                Appear normal
 Palate:                Not well visualized    Bladder:                Appears normal
 Thoracic:              Appears normal         Spine:                  Appears normal
 Heart:                 Appears normal         Upper Extremities:      Appears normal
                        (4CH, axis, and
                        situs)
 RVOT:                  Appears normal         Lower Extremities:      Appears normal

 Other:  Parents do not wish to know sex of fetus. Technically difficult due to
         fetal position.
Cervix Uterus Adnexa

 Cervix
 Length:              3  cm.
 Normal appearance by transabdominal scan.

 Uterus
 Single fibroid noted, see table below.

 Left Ovary
 Within normal limits.

 Right Ovary
 Within normal limits.

 Adnexa
 No abnormality visualized.
Myomas

  Site                     L(cm)      W(cm)      D(cm)       Location
  Posterior                3          2
 ----------------------------------------------------------------------
  Blood Flow                 RI        PI       Comments

 ----------------------------------------------------------------------
Impression

 Normal interval growth.  No ultrasonic evidence of structural
 fetal anomalies.
 Low risk NIPS
 Suboptimal views of the fetal anatomy obtained secondary to
 fetal postion
Recommendations

 Follow up growth in 4 weeks.

## 2021-09-07 IMAGING — US US MFM OB FOLLOW-UP
1 series · 13 of 28 positions shown · non-contrast
Comparison: none

[Series 1: us mfm ob follow-up · 13 of 57 slices shown]
[im 3/57]
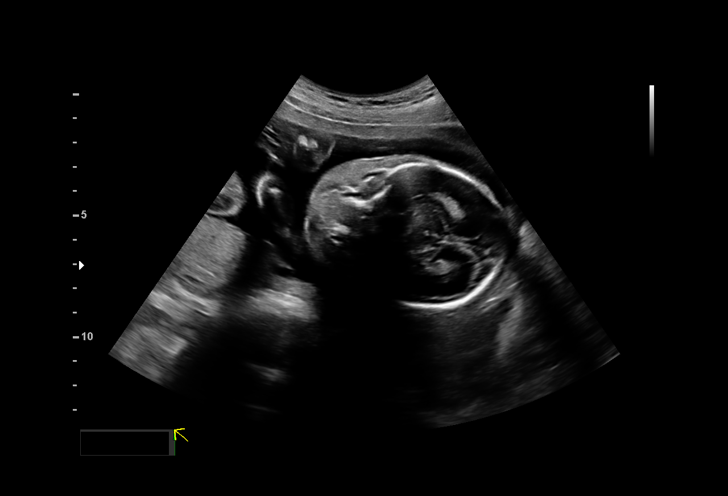
[im 7/57]
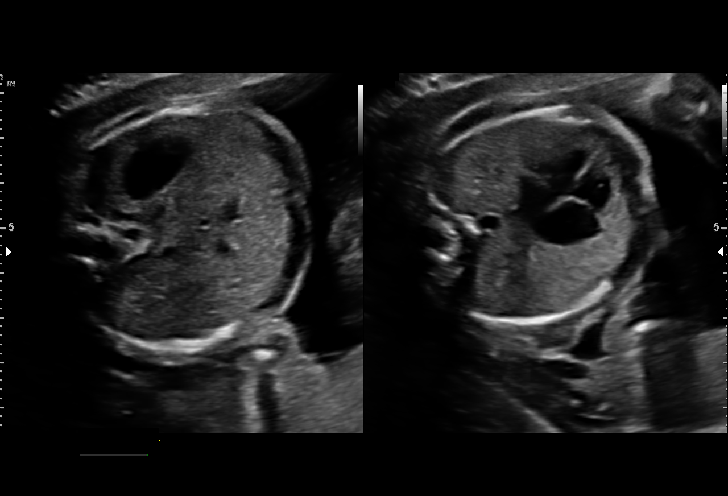
[im 11/57]
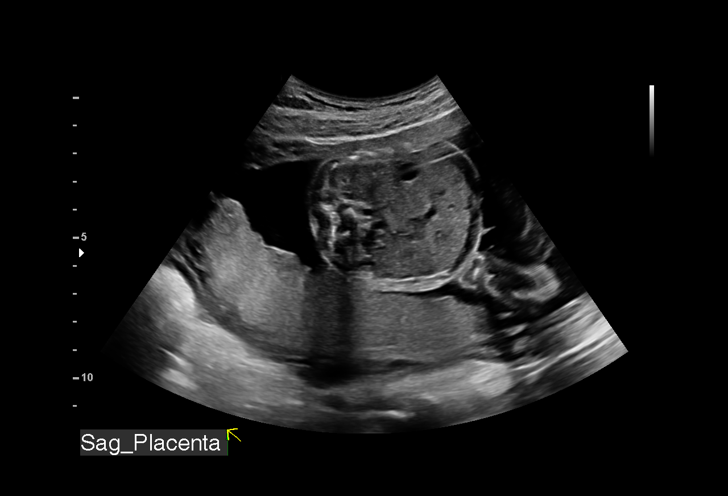
[im 15/57]
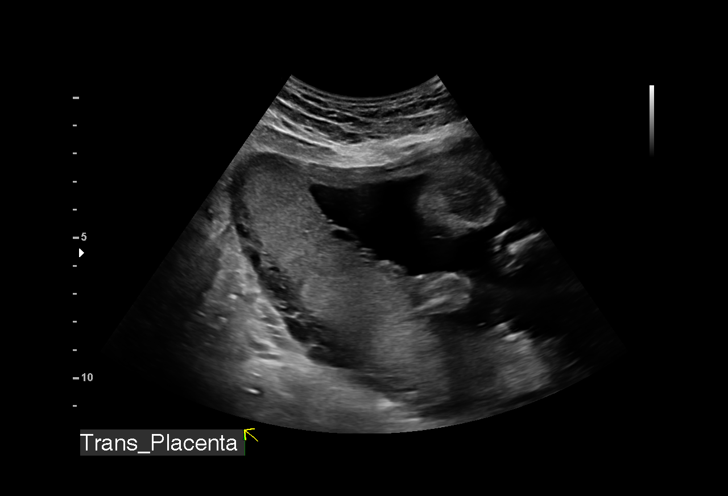
[im 19/57]
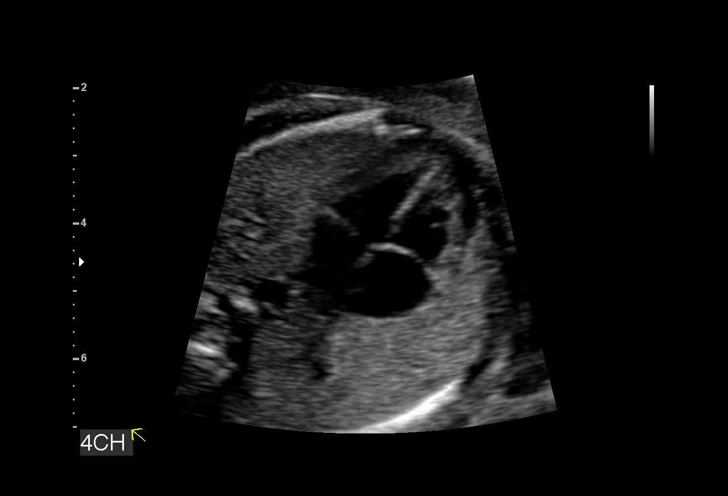
[im 23/57]
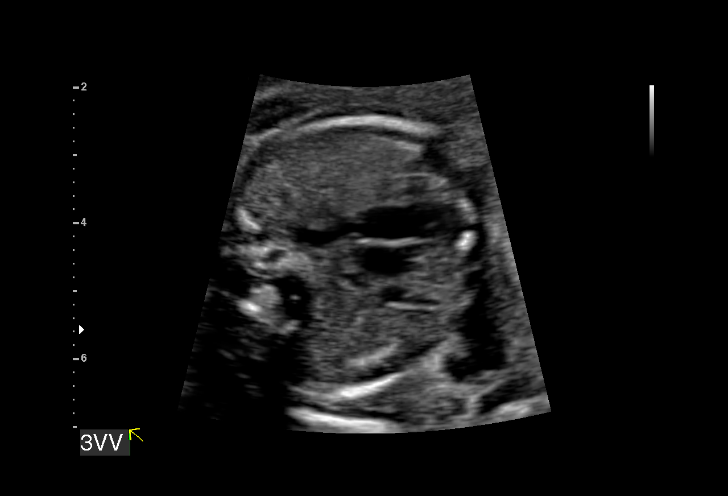
[im 30/57]
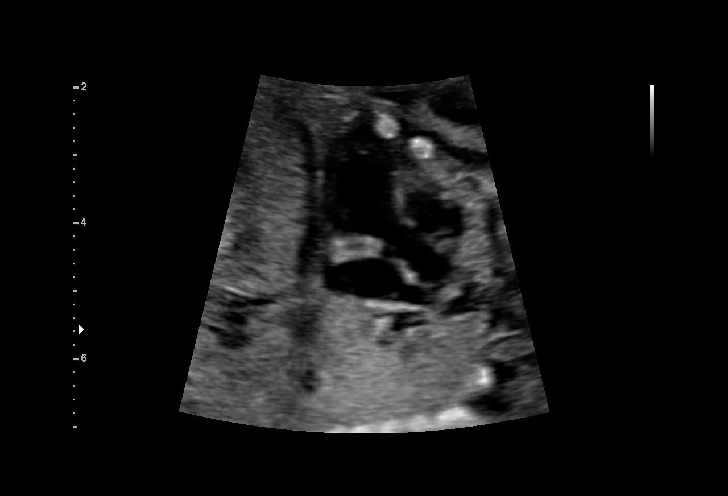
[im 34/57]
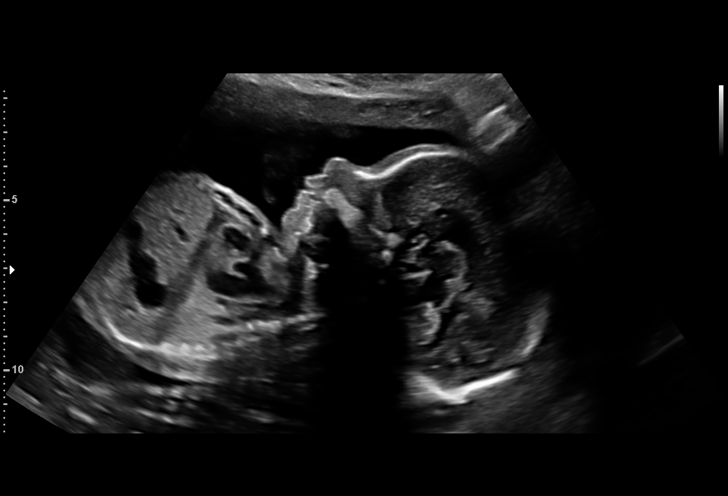
[im 38/57]
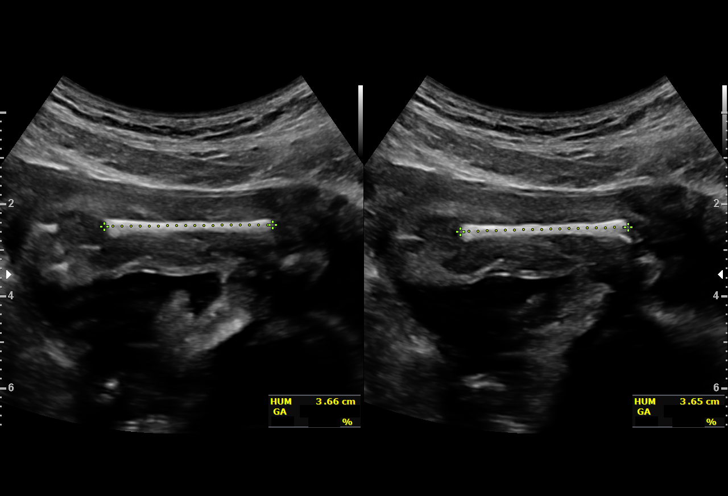
[im 42/57]
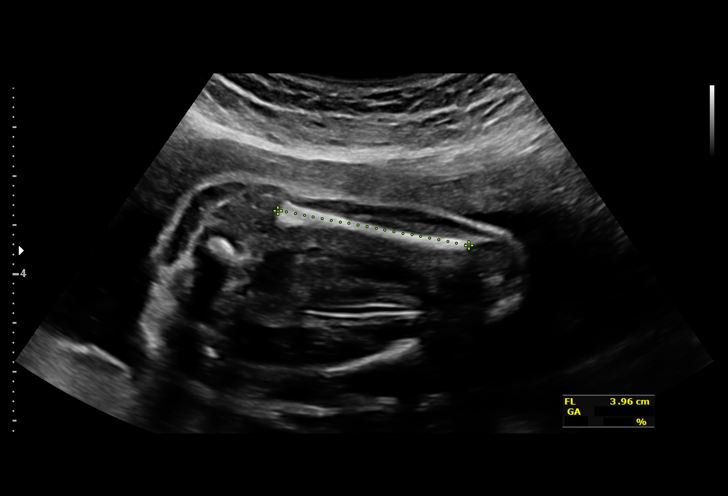
[im 46/57]
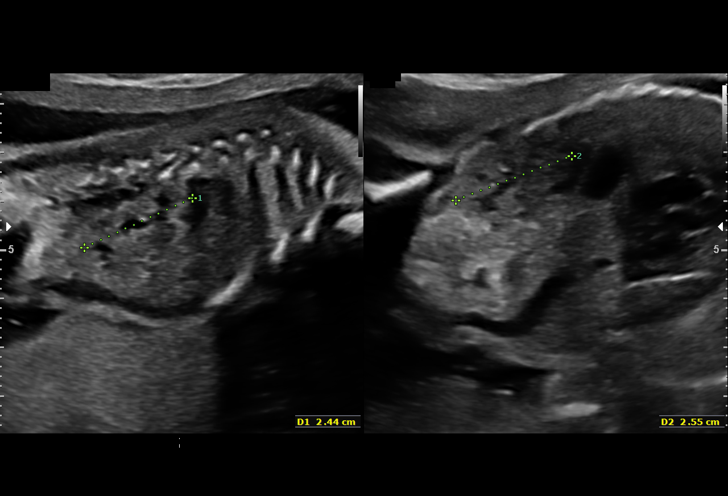
[im 50/57]
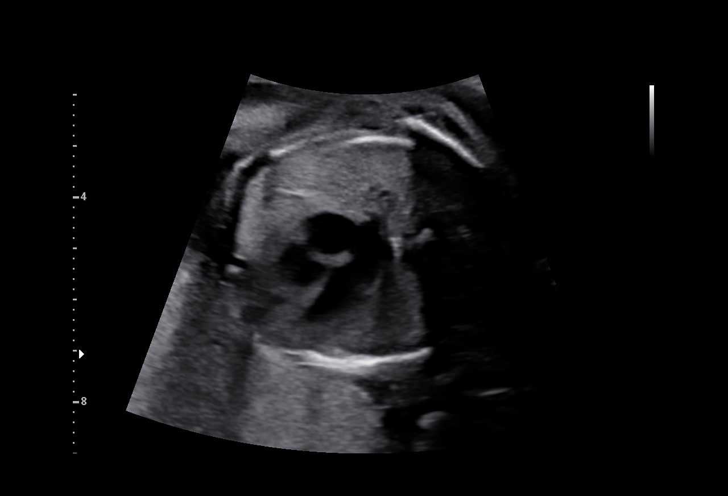
[im 54/57]
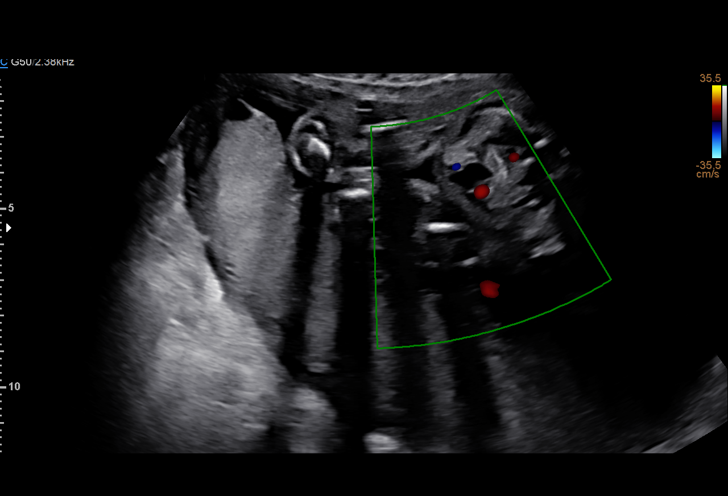

[13 of 28 positions shown; findings below may reference images not displayed]

DEORK NP

                                                       TRAVELS
 ----------------------------------------------------------------------

 ----------------------------------------------------------------------
Indications

  Fetal abnormality - other known or suspected
  Encounter for other antenatal screening
  follow-up
  23 weeks gestation of pregnancy
  Negative AFP (Low Risk NIPS)
 ----------------------------------------------------------------------
Vital Signs

                                                Height:        5'3"
Fetal Evaluation

 Num Of Fetuses:         1
 Fetal Heart Rate(bpm):  142
 Cardiac Activity:       Observed
 Presentation:           Cephalic
 Placenta:               Posterior
 P. Cord Insertion:      Visualized, central

 Amniotic Fluid
 AFI FV:      Within normal limits

                             Largest Pocket(cm)

Biometry

 BPD:      58.4  mm     G. Age:  23w 6d         63  %    CI:        77.89   %    70 - 86
                                                         FL/HC:      19.1   %    19.2 -
 HC:      209.4  mm     G. Age:  23w 0d         20  %    HC/AC:      1.10        1.05 -
 AC:      189.6  mm     G. Age:  23w 5d         51  %    FL/BPD:     68.5   %    71 - 87
 FL:         40  mm     G. Age:  22w 6d         22  %    FL/AC:      21.1   %    20 - 24
 HUM:      36.6  mm     G. Age:  22w 5d         28  %

 Est. FW:     587  gm      1 lb 5 oz     38  %
OB History

 Gravidity:    1
Gestational Age

 LMP:           23w 3d        Date:  09/28/18                 EDD:   07/05/19
 U/S Today:     23w 3d                                        EDD:   07/05/19
 Best:          23w 3d     Det. By:  LMP  (09/28/18)          EDD:   07/05/19
Anatomy

 Cranium:               Appears normal         Aortic Arch:            Previously seen
 Cavum:                 Appears normal         Ductal Arch:            Previously seen
 Ventricles:            Previously seen        Diaphragm:              Previously seen
 Choroid Plexus:        Previously seen        Stomach:                Appears normal, left
                                                                       sided
 Cerebellum:            Previously seen        Abdomen:                Previously seen
 Posterior Fossa:       Previously seen        Abdominal Wall:         Previously seen
 Nuchal Fold:           Previously seen        Cord Vessels:           Previously seen
 Face:                  Orbits and profile     Kidneys:                Appear normal
                        previously seen
 Lips:                  Appears normal         Bladder:                Appears normal
 Thoracic:              Appears normal         Spine:                  Previously seen
 Heart:                 Appears normal         Upper Extremities:      Previously seen
                        (4CH, axis, and
                        situs)
 RVOT:                  Previously seen        Lower Extremities:      Previously seen
 LVOT:                  Appears normal

 Other:  Parents do not wish to know sex of fetus. Technically difficult due to
         fetal position.
Cervix Uterus Adnexa

 Cervix
 Length:            3.3  cm.
 Normal appearance by transabdominal scan.

 Uterus
 No abnormality visualized.

 Left Ovary
 No adnexal mass visualized.

 Right Ovary
 No adnexal mass visualized.

 Cul De Sac
 No free fluid seen.

 Adnexa
 No abnormality visualized.
Comments

 This patient was seen for a follow up exam as the fetal
 cardiac views were unable to be fully visualized during her
 prior exam.  She denies any problems since her last exam.
 She was informed that the fetal growth and amniotic fluid
 level appears appropriate for her gestational age.
 The views of the fetal heart were visualized today.  There
 were no obvious anomalies suspected.  The limitations of
 ultrasound in the detection of all anomalies was discussed.
 Follow-up as indicated.

## 2022-03-27 ENCOUNTER — Ambulatory Visit (INDEPENDENT_AMBULATORY_CARE_PROVIDER_SITE_OTHER): Payer: Medicaid Other | Admitting: *Deleted

## 2022-03-27 DIAGNOSIS — Z3201 Encounter for pregnancy test, result positive: Secondary | ICD-10-CM

## 2022-03-27 DIAGNOSIS — Z3200 Encounter for pregnancy test, result unknown: Secondary | ICD-10-CM

## 2022-03-27 LAB — POCT PREGNANCY, URINE: Preg Test, Ur: POSITIVE — AB

## 2022-03-27 MED ORDER — PRENATAL VITAMIN 27-0.8 MG PO TABS: 1.0000 | ORAL_TABLET | Freq: Every day | ORAL | 11 refills | Status: DC

## 2022-03-27 NOTE — Progress Notes (Signed)
Patient dropped off urine for pregnancy test which was positive. I called Sally Crosby and informed her of results. She report LMP 02/09/22 which she is sure of and was a normal period and she has regular periods. I informed her EDD 11/16/22 . I advised to start prenatal care and prenatal vitamins. She plans to go here again and will call front desk to schedule RX sent in for PNV. I also discussed choices of prenatal care. She chooses Traditional care but will consider Centering[facility]regnancy. She prefers individual appointments.  Staci Acosta

## 2022-03-31 NOTE — L&D Delivery Note (Signed)
OB/[facility]N Faculty Practice Delivery Note  Sally Crosby is a 32 y.o. G3P2002 at [redacted]w[redacted]d admitted for post-term IOL.   GBS Status: Negative/-- (07/26 0943) Maximum Maternal Temperature: 98.2  Labor course: Initial SVE: 5 cm. Augmentation with: AROM and Pitocin. She then progressed to complete.  ROM: 2h 41m with clear fluid  Birth: At 1748 a viable female was delivered via spontaneous vaginal delivery (Presentation: ROA). Nuchal cord present: Yes  loose, easily reduced over fetal head.  Shoulders and body delivered in usual fashion. Infant placed directly on mom's abdomen for bonding/skin-to-skin, baby dried and stimulated. Cord clamped x 2 after 1 minute and cut by FOB, Jon.  Cord blood collected.  The placenta separated spontaneously and delivered via gentle cord traction.  Pitocin infused rapidly IV per protocol.  Fundus firm with massage.  Placenta inspected and appears to be intact with a 3 VC.  Placenta/Cord with the following complications: none.  Cord pH: n/a Sponge and instrument count were correct x2.  Intrapartum complications:  None Anesthesia:  epidural Episiotomy: none Lacerations:  none Suture Repair:  n/a EBL (mL): 58   Infant: APGAR (1 MIN): 9  APGAR (5 MINS): 9  Infant weight: pending  Mom to postpartum.  Baby to Couplet care / Skin to Skin. Placenta to L&D   Plans to Breastfeed Contraception: vasectomy Circumcision: N/A  Note sent to [facility]: [facility] for pp visit.  Raelyn Mora , MSN, CNM 11/26/2022 6:19 PM

## 2022-04-17 ENCOUNTER — Telehealth (INDEPENDENT_AMBULATORY_CARE_PROVIDER_SITE_OTHER): Payer: Medicaid Other

## 2022-04-17 DIAGNOSIS — Z3480 Encounter for supervision of other normal pregnancy, unspecified trimester: Secondary | ICD-10-CM | POA: Insufficient documentation

## 2022-04-17 DIAGNOSIS — Z3689 Encounter for other specified antenatal screening: Secondary | ICD-10-CM

## 2022-04-17 NOTE — Progress Notes (Signed)
New OB Intake  I connected with Sally Crosby  on 04/17/22 at  2:15 PM EST by MyChart Video Visit and verified that I am speaking with the correct person using two identifiers. Nurse is located at [facility] and pt is located at home.  I discussed the limitations, risks, security and privacy concerns of performing an evaluation and management service by telephone and the availability of in person appointments. I also discussed with the patient that there may be a patient responsible charge related to this service. The patient expressed understanding and agreed to proceed.  I explained I am completing New OB Intake today. We discussed EDD of 11/16/2022 that is based on LMP of 02/09/2022. Pt is G3/P2. I reviewed her allergies, medications, Medical/Surgical/OB history, and appropriate screenings. I informed her of [facility] services. [facility] information placed in AVS. Based on history, this is a low risk pregnancy.  Patient Active Problem List   Diagnosis Date Noted   Supervision of other normal pregnancy, antepartum 04/17/2022   Anxiety 07/20/2020   Hx Type 3a perineal laceration 07/09/2019   H/O eczema 07/06/2019   Alpha thalassemia silent carrier 02/17/2019   History of herpes genitalis 01/06/2019     Delivery Plans Plans to deliver at [facility]. Patient given information for [facility] Healthy Baby website for more information about [facility] [place]. Patient is not interested in water birth. Offered upcoming OB visit with CNM to discuss further.  MyChart/Babyscripts MyChart access verified. I explained pt will have some visits in office and some virtually. Babyscripts instructions given and order placed. Patient verifies receipt of registration text/e-mail. Account successfully created and app downloaded.  Blood Pressure Cuff/Weight Scale Patient has her own blood pressure device. Explained after first prenatal appt pt will check weekly and document in 51. Patient does not have weight  scale; patient may purchase if they desire to track weight weekly in Babyscripts.  Anatomy US Explained first scheduled [place] will be around 19 weeks. Anatomy US scheduled for 06/23/2022 at 09:45 AM. Pt notified to arrive at 09:30 AM.  Labs Discussed Natera genetic screening with patient. Patient would like panorama screening. Patient is a carrier for Alpha Thalassemia.   COVID Vaccine Patient Accepted had COVID vaccine.   Is patient a CenteringPregnancy candidate?  Declined Declined due to Schedule   Is patient a Mom+Baby Combined Care candidate?  Not a candidate   If accepted, Mom+Baby staff notified  Social Determinants of Health Food Insecurity: Patient denies food insecurity. WIC Referral: Patient is interested in referral to [facility].  Transportation: Patient denies transportation needs. Childcare: Discussed no children allowed at ultrasound appointments. Offered childcare services; patient declines childcare services at this time.  First visit review I reviewed new OB appt with patient. I explained they will have a provider visit that includes initial ob labs, panorama screening, OB Culture, GC/CH, and hemoglobin A1C. Explained pt will be seen by Sally Plummer Autry-Lott DO at first visit; encounter routed to appropriate provider. Explained that patient will be seen by pregnancy navigator following visit with provider.   Mariane Baumgarten, [place] 04/17/2022  2:22 PM

## 2022-04-22 ENCOUNTER — Telehealth: Payer: Self-pay

## 2022-04-22 DIAGNOSIS — Z3482 Encounter for supervision of other normal pregnancy, second trimester: Secondary | ICD-10-CM

## 2022-04-22 NOTE — Telephone Encounter (Signed)
Patient called and left VM requesting refill of prenatal vitamins.

## 2022-04-23 MED ORDER — PRENATAL VITAMIN 27-0.8 MG PO TABS: 1.0000 | ORAL_TABLET | Freq: Every day | ORAL | 3 refills | Status: DC

## 2022-04-23 NOTE — Addendum Note (Signed)
Addended by: Langston Reusing on: 04/23/2022 03:26 PM   Modules accepted: Orders

## 2022-04-23 NOTE — Telephone Encounter (Signed)
Called pt and left message on her personal voicemail stating that I have sent in the refill as requested.

## 2022-05-01 ENCOUNTER — Encounter: Payer: Self-pay | Admitting: Medical

## 2022-05-01 ENCOUNTER — Other Ambulatory Visit: Payer: Self-pay

## 2022-05-01 MED ORDER — PROMETHAZINE HCL 25 MG PO TABS: 25.0000 mg | ORAL_TABLET | Freq: Four times a day (QID) | ORAL | 1 refills | Status: DC | PRN

## 2022-05-05 ENCOUNTER — Other Ambulatory Visit: Payer: Self-pay

## 2022-05-05 ENCOUNTER — Other Ambulatory Visit (HOSPITAL_COMMUNITY)
Admission: RE | Admit: 2022-05-05 | Discharge: 2022-05-05 | Disposition: A | Discharge status: Home / Self Care | Payer: Medicaid Other | Source: Ambulatory Visit | Attending: Medical | Admitting: Medical

## 2022-05-05 ENCOUNTER — Encounter: Payer: Self-pay | Admitting: Family Medicine

## 2022-05-05 ENCOUNTER — Ambulatory Visit (INDEPENDENT_AMBULATORY_CARE_PROVIDER_SITE_OTHER): Payer: Medicaid Other | Admitting: Family Medicine

## 2022-05-05 VITALS — BP 126/70 | HR 69 | Wt 155.0 lb

## 2022-05-05 DIAGNOSIS — Z3A12 12 weeks gestation of pregnancy: Secondary | ICD-10-CM | POA: Diagnosis not present

## 2022-05-05 DIAGNOSIS — Z3480 Encounter for supervision of other normal pregnancy, unspecified trimester: Secondary | ICD-10-CM | POA: Diagnosis not present

## 2022-05-05 DIAGNOSIS — Z3481 Encounter for supervision of other normal pregnancy, first trimester: Secondary | ICD-10-CM

## 2022-05-05 NOTE — Progress Notes (Addendum)
INITIAL PRENATAL VISIT  Subjective:   Sally Crosby is being seen today for her first obstetrical visit.  This is not a planned pregnancy. This is a desired pregnancy.  She is at 42w1dgestation by LMP (02/09/2022). Her obstetrical history is significant for  not 3rd degree laceration in her last pregnancy . Relationship with FOB:  not assessed . Patient does intend to breast feed. Pregnancy history fully reviewed.  Patient reports nausea and vomiting daily, but is able keep down food and fluids every day, pt denies weight loss. Indications for ASA therapy (per uptodate) One of the following: Previous pregnancy with preeclampsia, especially early onset and with an adverse outcome No Multifetal gestation No Chronic hypertension No Type 1 or 2 diabetes mellitus No Chronic kidney disease No Autoimmune disease (antiphospholipid syndrome, systemic lupus erythematosus) No  Two or more of the following: Nulliparity No Obesity (body mass index >30 kg/m2) No Family history of preeclampsia in mother or sister Not assessed Age ?35 years No Sociodemographic characteristics (African American race, low socioeconomic level) Yes Personal risk factors (eg, previous pregnancy with low birth weight or small for gestational age infant, previous adverse pregnancy outcome [eg, stillbirth], interval >10 years between pregnancies) No  Indications for early GDM screening  First-degree relative with diabetes No BMI >30kg/m2 No Age > 25 Yes Previous birth of an infant weighing ?4000 g No Gestational diabetes mellitus in a previous pregnancy No Glycated hemoglobin ?5.7 percent (39 mmol/mol), impaired glucose tolerance, or impaired fasting glucose on previous testing Yes High-risk race/ethnicity (eg, African American, Latino, Native American, Asian American, Pacific Islander) Yes Previous stillbirth of unknown cause No Maternal birthweight > 9 lbs No History of cardiovascular disease No Hypertension or on  therapy for hypertension No High-density lipoprotein cholesterol level <35 mg/dL (0.90 mmol/L) and/or a triglyceride level >250 mg/dL (2.82 mmol/L) No Polycystic ovary syndrome No Physical inactivity No Other clinical condition associated with insulin resistance (eg, severe obesity, acanthosis nigricans) No Current use of glucocorticoids No   Early screening tests: A1C   Review of Systems:   Review of Systems  Objective:    Obstetric History OB History  Gravida Para Term Preterm AB Living  '3 2 2     2  '$ SAB IAB Ectopic Multiple Live Births        0 2    # Outcome Date GA Lbr Len/2nd Weight Sex Delivery Anes PTL Lv  3 Current           2 Term 02/08/21 333w5d5:15 / 02:19 7 lb 7 oz (3.374 kg) M Vag-Spont EPI  LIV     Birth Comments: wnl  1 Term 07/09/19 [id number] 7 lb 11.8 oz (3.51 kg) F Vag-Spont EPI  LIV    Past Medical History:  Diagnosis Date   Anxiety    Fibroids 02/17/2019   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 adolescence 07/20/2020    Past Surgical History:  Procedure Laterality Date   HERNIA REPAIR  1999    Current Outpatient Medications on File Prior to Visit  Medication Sig Dispense Refill   Prenatal Vit-Fe Fumarate-FA (PRENATAL VITAMIN) 27-0.8 MG TABS Take 1 tablet by mouth daily. 90 tablet 3   aspirin EC 81 MG tablet Take 1 tablet (81 mg total) by mouth daily. Swallow whole. 90 tablet 3   promethazine (PHENERGAN) 25 MG tablet Take 1 tablet (25 mg total) by mouth every 6 (six) hours as needed for nausea or  vomiting. (Patient not taking: Reported on 05/05/2022) 30 tablet 1   No current facility-administered medications on file prior to visit.    No Known Allergies  Social History:  reports that she has never smoked. She has never used smokeless tobacco. She reports that she does not currently use alcohol. She reports that she does not use drugs.  Family History  Problem Relation Age of Onset    Hypertension Mother     The following portions of the patient's history were reviewed and updated as appropriate: allergies, current medications, past family history, past medical history, past social history, past surgical history and problem list.  Review of Systems Negative.   Physical Exam:  BP 126/70   Pulse 69   Wt 155 lb (70.3 kg)   LMP 02/09/2022   BMI 27.46 kg/m  CONSTITUTIONAL: Well-developed, well-nourished female in no acute distress.  HENT:  Normocephalic, atraumatic, External right and left ear normal. Oropharynx is clear and moist EYES: Conjunctivae normal. No scleral icterus.  NECK: Normal range of motion, supple, no masses.  Normal thyroid.  SKIN: Skin is warm and dry. No rash noted. Not diaphoretic. No erythema. No pallor. MUSCULOSKELETAL: Normal range of motion. No tenderness.  No cyanosis, clubbing, or edema.   NEUROLOGIC: Alert and oriented to person, place, and time. Normal muscle tone coordination.  PSYCHIATRIC: Normal mood and affect. Normal behavior. Normal judgment and thought content. CARDIOVASCULAR: Normal heart rate noted, regular rhythm RESPIRATORY: Clear to auscultation bilaterally. Effort and breath sounds normal, no problems with respiration noted. ABDOMEN: Soft, normal bowel sounds, no distention noted.  No tenderness, rebound or guarding. Fundal ht: not assessed  PELVIC: Pap smear 10/25/2019 obtained NILM.  FHR: 150   Assessment:    Pregnancy: G3P2002 1. Supervision of other normal pregnancy, antepartum - CBC/D/Plt+RPR+Rh+ABO+RubIgG... - Hemoglobin A1c - Culture, OB Urine - GC/Chlamydia probe amp ([place])not at [facility] - Panorama Prenatal Test Full Panel  2. [redacted] weeks gestation of pregnancy - Anatomy u/s at 19 wks on 06/23/2022     Plan:    Initial labs drawn. Prenatal vitamins. Problem list reviewed and updated. Reviewed in detail the nature of the practice with collaborative care between  Genetic screening discussed:  NIPS/First trimester screen/Quad/AFP ordered. Role of ultrasound in pregnancy discussed; Anatomy US: ordered. Amniocentesis discussed: not indicated. Follow up in 4 weeks. Discussed clinic routines, schedule of care and testing, genetic screening options, involvement of students and residents under the direct supervision of APPs and doctors and presence of female providers. Pt verbalized understanding.   Gerlene Fee, DO 05/05/2022 11:55 AM  I was personally present and re-performed the exam and MDM and verified the service and findings are accurately documented in the student's note. [place], DO 05/05/22 11:55 AM

## 2022-05-05 NOTE — Addendum Note (Signed)
Addended by: Caralee Ates on: 05/05/2022 11:57 AM   Modules accepted: Orders

## 2022-05-06 LAB — CBC/D/PLT+RPR+RH+ABO+RUBIGG...
Antibody Screen: NEGATIVE
Basophils Absolute: 0 10*3/uL (ref 0.0–0.2)
Basos: 0 %
EOS (ABSOLUTE): 0.1 10*3/uL (ref 0.0–0.4)
Eos: 2 %
HCV Ab: NONREACTIVE
HIV Screen 4th Generation wRfx: NONREACTIVE
Hematocrit: 37.7 % (ref 34.0–46.6)
Hemoglobin: 12.7 g/dL (ref 11.1–15.9)
Hepatitis B Surface Ag: NEGATIVE
Immature Grans (Abs): 0 10*3/uL (ref 0.0–0.1)
Immature Granulocytes: 0 %
Lymphocytes Absolute: 2.4 10*3/uL (ref 0.7–3.1)
Lymphs: 37 %
MCH: 27.4 pg (ref 26.6–33.0)
MCHC: 33.7 g/dL (ref 31.5–35.7)
MCV: 81 fL (ref 79–97)
Monocytes Absolute: 0.5 10*3/uL (ref 0.1–0.9)
Monocytes: 7 %
Neutrophils Absolute: 3.5 10*3/uL (ref 1.4–7.0)
Neutrophils: 54 %
Platelets: 281 10*3/uL (ref 150–450)
RBC: 4.63 x10E6/uL (ref 3.77–5.28)
RDW: 12.8 % (ref 11.7–15.4)
RPR Ser Ql: NONREACTIVE
Rh Factor: POSITIVE
Rubella Antibodies, IGG: 1.48 index (ref >0.99)
WBC: 6.6 10*3/uL (ref 3.4–10.8)

## 2022-05-06 LAB — GC/CHLAMYDIA PROBE AMP (~~LOC~~) NOT AT ARMC
Chlamydia: NEGATIVE
Comment: NEGATIVE
Comment: NORMAL
Neisseria Gonorrhea: NEGATIVE

## 2022-05-06 LAB — HEMOGLOBIN A1C
Est. average glucose Bld gHb Est-mCnc: 126 mg/dL
Hgb A1c MFr Bld: 6 % — ABNORMAL HIGH (ref 4.8–5.6)

## 2022-05-06 LAB — HCV INTERPRETATION

## 2022-05-07 ENCOUNTER — Telehealth: Payer: Self-pay

## 2022-05-07 LAB — URINE CULTURE, OB REFLEX: Organism ID, Bacteria: NO GROWTH

## 2022-05-07 LAB — CULTURE, OB URINE

## 2022-05-07 NOTE — Telephone Encounter (Signed)
Patient called and left message requesting that we inform her of genetic testing results--patient notified that she had test results through [place], but did not want to read results because she thought they were the genetic testing results and does not want to know gender of baby.   Returned call to patient at number provided in message, [phone] patient using full name and DOB. Informed patient that the message and notification that she received through [place] were not in regards to her genetic testing, but instead they were in regards to her other New OB testing that we did at her New OB visit from two days ago, 05/05/22.   Reminded patient that the genetic test results typically take between 1-2 weeks to receive from [facility], so she is able to open the latest message and test results that Dr. Janus Molder sent to her.   Patient verbalized understanding and stated that she would review the message and results on her own; patient did not have further questions or concerns, but will reach back out if she needs clarification on any of the latest results.

## 2022-05-08 ENCOUNTER — Encounter: Payer: Self-pay | Admitting: *Deleted

## 2022-05-10 LAB — PANORAMA PRENATAL TEST FULL PANEL:PANORAMA TEST PLUS 5 ADDITIONAL MICRODELETIONS: FETAL FRACTION: 8.4

## 2022-06-02 ENCOUNTER — Other Ambulatory Visit (HOSPITAL_COMMUNITY)
Admission: RE | Admit: 2022-06-02 | Discharge: 2022-06-02 | Disposition: A | Discharge status: Home / Self Care | Payer: Medicaid Other | Source: Ambulatory Visit | Attending: Family Medicine | Admitting: Family Medicine

## 2022-06-02 ENCOUNTER — Ambulatory Visit (INDEPENDENT_AMBULATORY_CARE_PROVIDER_SITE_OTHER): Payer: Medicaid Other | Admitting: Family Medicine

## 2022-06-02 ENCOUNTER — Other Ambulatory Visit: Payer: Self-pay

## 2022-06-02 VITALS — BP 118/72 | HR 74 | Wt 157.5 lb

## 2022-06-02 DIAGNOSIS — Z8619 Personal history of other infectious and parasitic diseases: Secondary | ICD-10-CM

## 2022-06-02 DIAGNOSIS — Z3A16 16 weeks gestation of pregnancy: Secondary | ICD-10-CM

## 2022-06-02 DIAGNOSIS — Z3480 Encounter for supervision of other normal pregnancy, unspecified trimester: Secondary | ICD-10-CM | POA: Diagnosis present

## 2022-06-02 DIAGNOSIS — Z3482 Encounter for supervision of other normal pregnancy, second trimester: Secondary | ICD-10-CM

## 2022-06-02 DIAGNOSIS — F988 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 adolescence: Secondary | ICD-10-CM | POA: Insufficient documentation

## 2022-06-02 DIAGNOSIS — N898 Other specified noninflammatory disorders of vagina: Secondary | ICD-10-CM

## 2022-06-02 DIAGNOSIS — F419 Anxiety disorder, unspecified: Secondary | ICD-10-CM

## 2022-06-02 MED ORDER — TERCONAZOLE 0.4 % VA CREA: 1.0000 | TOPICAL_CREAM | Freq: Every day | VAGINAL | 0 refills | Status: DC

## 2022-06-02 NOTE — Progress Notes (Cosign Needed Addendum)
PRENATAL VISIT NOTE  Subjective:  Sally Crosby is a 32 y.o. G3P2002 at 10w1dbeing seen today for ongoing prenatal care.  She is currently monitored for the following issues for this high-risk pregnancy and has History of herpes genitalis; Alpha thalassemia silent carrier; H/O eczema; Hx Type 3a perineal laceration; Anxiety; Essential hypertension; Supervision of other normal pregnancy, antepartum; and ADD (attention deficit disorder) without hyperactivity on their problem list.  Patient reports vaginal irritation. Was taking terconazole and found relief.  Contractions: Not present. Vag. Bleeding: None.  Movement: Absent. Denies leaking of fluid.   The following portions of the patient's history were reviewed and updated as appropriate: allergies, current medications, past family history, past medical history, past social history, past surgical history and problem list.   Objective:   Vitals:   06/02/22 0958 06/02/22 1007  BP: 131/77 118/72  Pulse: 87 74  Weight: 157 lb 8 oz (71.4 kg)     Fetal Status: Fetal Heart Rate (bpm): 140   Movement: Absent     General:  Alert, oriented and cooperative. Patient is in no acute distress.  Skin: Skin is warm and dry. No rash noted.   Cardiovascular: Normal heart rate noted  Respiratory: Normal respiratory effort, no problems with respiration noted  Abdomen: Soft, gravid, appropriate for gestational age.  Pain/Pressure: Absent     Pelvic: Cervical exam deferred        Extremities: Normal range of motion.  Edema: None  Mental Status: Normal mood and affect. Normal behavior. Normal judgment and thought content.   Assessment and Plan:  Pregnancy: G3P2002 at 154w1d1. Supervision of other normal pregnancy, antepartum - terconazole (TERAZOL 7) 0.4 % vaginal cream; Place 1 applicator vaginally at bedtime.  Dispense: 45 g; Refill: 0 - Cervicovaginal ancillary only - AFP, Serum, Open Spina Bifida  2. History of herpes genitalis  3.  Anxiety  4. Vaginal itching   Needs early 2hr gtt-- will schedule to get done ASAP [place] Anxiety well controlled  AFP today Swab for vaginal itching- sent terconazole since sx are similar from prior  Nursing Staff Provider  Office Location [facility] Dating  11/16/2022, by Last Menstrual Period  [facility] [XValu.Nieves Traditional '[ ]'$  Centering '[ ]'$  Mom-Baby Dyad    Language  English Anatomy [place]  Flu Vaccine  [facility] Genetic/Carrier Screen  NIPS:  LR  AFP:    [place]:  TDaP Vaccine    Hgb A1C or  GTT Early  Third trimester   COVID Vaccine    LAB RESULTS   Rhogam  A/Positive/-- (02/05 1044)  Blood Type A/Positive/-- (02/05 1044)   Baby Feeding Plan Breast  Antibody Negative (02/05 1044)  Contraception Yes, need options  Rubella 1.48 (02/05 1044)  Circumcision If boy Yes  RPR Non Reactive (02/05 1044)   Pediatrician  [facility] HBsAg Negative (02/05 1044)   Support Person FOB--John HCVAb Non Reactive (02/05 1044)   Prenatal Classes  HIV Non Reactive (02/05 1044)     BTL Consent  GBS   (For PCN allergy, check sensitivities)   VBAC Consent  Pap Diagnosis  Date Value Ref Range Status  10/25/2019   Final   - Negative for intraepithelial lesion or malignancy (NILM)         DME Rx '[ ]'$  BP cuff '[ ]'$  Weight Scale Waterbirth  '[ ]'$  Class '[ ]'$  Consent '[ ]'$  CNM visit  PHQ9 & GAD7 [  ] new OB [  ] 28 weeks  [  ] 36 weeks Induction  '[ ]'$  Orders Entered '[ ]'$ Foley Y/N     Preterm labor symptoms and general obstetric precautions including but not limited to vaginal bleeding, contractions, leaking of fluid and fetal movement were reviewed in detail with the patient. Please refer to After Visit Summary for other counseling recommendations.   Return in about 1 week (around 06/09/2022) for Glucola.  Future Appointments  Date Time Provider [place]  06/11/2022  9:30 AM [facility]  06/23/2022  9:30 AM [facility]  NURSE [facility]  06/23/2022  9:45 AM [facility] US4 [facility]  06/30/2022  2:35 PM Darliss Cheney, MD [facility], [place] Fellow, Faculty practice [place] for [facility] 06/02/22  10:47 AM

## 2022-06-03 LAB — CERVICOVAGINAL ANCILLARY ONLY
Bacterial Vaginitis (gardnerella): NEGATIVE
Candida Glabrata: NEGATIVE
Candida Vaginitis: POSITIVE — AB
Comment: NEGATIVE
Comment: NEGATIVE
Comment: NEGATIVE
Comment: NEGATIVE
Trichomonas: NEGATIVE

## 2022-06-04 ENCOUNTER — Other Ambulatory Visit: Payer: Self-pay | Admitting: Family Medicine

## 2022-06-04 DIAGNOSIS — Z3480 Encounter for supervision of other normal pregnancy, unspecified trimester: Secondary | ICD-10-CM

## 2022-06-04 LAB — AFP, SERUM, OPEN SPINA BIFIDA
AFP MoM: 0.66
AFP Value: 23.8 ng/mL
Gest. Age on Collection Date: 16.1 weeks
Maternal Age At EDD: 32.7 yr
OSBR Risk 1 IN: 10000
Test Results:: NEGATIVE
Weight: 159 [lb_av]

## 2022-06-04 MED ORDER — TERCONAZOLE 0.4 % VA CREA: 1.0000 | TOPICAL_CREAM | Freq: Every day | VAGINAL | 0 refills | Status: DC

## 2022-06-11 ENCOUNTER — Other Ambulatory Visit: Payer: Self-pay | Admitting: General Practice

## 2022-06-11 ENCOUNTER — Other Ambulatory Visit: Payer: Medicaid Other

## 2022-06-11 ENCOUNTER — Other Ambulatory Visit: Payer: Self-pay

## 2022-06-11 DIAGNOSIS — Z3480 Encounter for supervision of other normal pregnancy, unspecified trimester: Secondary | ICD-10-CM

## 2022-06-12 LAB — GLUCOSE TOLERANCE, 2 HOURS W/ 1HR
Glucose, 1 hour: 134 mg/dL (ref 70–179)
Glucose, 2 hour: 94 mg/dL (ref 70–152)
Glucose, Fasting: 86 mg/dL (ref 70–91)

## 2022-06-23 ENCOUNTER — Ambulatory Visit: Payer: Medicaid Other | Admitting: *Deleted

## 2022-06-23 ENCOUNTER — Encounter: Payer: Self-pay | Admitting: *Deleted

## 2022-06-23 ENCOUNTER — Ambulatory Visit: Payer: Medicaid Other | Attending: Family Medicine

## 2022-06-23 VITALS — BP 123/62 | HR 83

## 2022-06-23 DIAGNOSIS — Z3689 Encounter for other specified antenatal screening: Secondary | ICD-10-CM | POA: Insufficient documentation

## 2022-06-23 DIAGNOSIS — O99012 Anemia complicating pregnancy, second trimester: Secondary | ICD-10-CM | POA: Insufficient documentation

## 2022-06-23 DIAGNOSIS — D569 Thalassemia, unspecified: Secondary | ICD-10-CM | POA: Insufficient documentation

## 2022-06-23 DIAGNOSIS — Z363 Encounter for antenatal screening for malformations: Secondary | ICD-10-CM | POA: Diagnosis not present

## 2022-06-23 DIAGNOSIS — Z3A19 19 weeks gestation of pregnancy: Secondary | ICD-10-CM | POA: Diagnosis not present

## 2022-06-23 DIAGNOSIS — Z3480 Encounter for supervision of other normal pregnancy, unspecified trimester: Secondary | ICD-10-CM | POA: Diagnosis not present

## 2022-06-30 ENCOUNTER — Telehealth (INDEPENDENT_AMBULATORY_CARE_PROVIDER_SITE_OTHER): Payer: Medicaid Other | Admitting: Obstetrics and Gynecology

## 2022-06-30 VITALS — BP 127/79 | HR 80

## 2022-06-30 DIAGNOSIS — Z3482 Encounter for supervision of other normal pregnancy, second trimester: Secondary | ICD-10-CM

## 2022-06-30 DIAGNOSIS — Z3A20 20 weeks gestation of pregnancy: Secondary | ICD-10-CM

## 2022-06-30 DIAGNOSIS — Z3480 Encounter for supervision of other normal pregnancy, unspecified trimester: Secondary | ICD-10-CM

## 2022-06-30 NOTE — Progress Notes (Signed)
OBSTETRICS PRENATAL VIRTUAL VISIT ENCOUNTER NOTE  Provider location: [facility] for [place] [facility] [place] [facility]   Patient location: Home  I connected with Sally Crosby on 06/30/22 at  2:35 PM EDT by MyChart Video Encounter and verified that I am speaking with the correct person using two identifiers. I discussed the limitations, risks, security and privacy concerns of performing an evaluation and management service virtually and the availability of in person appointments. I also discussed with the patient that there may be a patient responsible charge related to this service. The patient expressed understanding and agreed to proceed. Subjective:  Sally Crosby is a 32 y.o. G3P2002 at [redacted]w[redacted]d being seen today for ongoing prenatal care.  She is currently monitored for the following issues for this low-risk pregnancy and has History of herpes genitalis; Alpha thalassemia silent carrier; H/O eczema; Hx Type 3a perineal laceration; Anxiety; Essential hypertension; Supervision of other normal pregnancy, antepartum; and ADD (attention deficit disorder) without hyperactivity on their problem list.  Patient reports no complaints.  Contractions: Not present. Vag. Bleeding: None.  Movement: Present. Denies any leaking of fluid.   The following portions of the patient's history were reviewed and updated as appropriate: allergies, current medications, past family history, past medical history, past social history, past surgical history and problem list.   Objective:   Vitals:   06/30/22 1439  BP: 127/79  Pulse: 80    Fetal Status:     Movement: Present     General:  Alert, oriented and cooperative. Patient is in no acute distress.  Respiratory: Normal respiratory effort, no problems with respiration noted  Mental Status: Normal mood and affect. Normal behavior. Normal judgment and thought content.  Rest of physical exam deferred due to type of encounter  Imaging: [place] MFM OB DETAIL +14  WK  Result Date: 06/23/2022 ----------------------------------------------------------------------  OBSTETRICS REPORT                       (Signed Final 06/23/2022 11:02 am) ---------------------------------------------------------------------- Patient Info  ID #:       [id number]                          D.O.B.:  1990/09/13 (32 yrs)  Name:       Sally Crosby                  Visit Date: 06/23/2022 08:29 am ---------------------------------------------------------------------- Performed By  Attending:        Tama High MD        Ref. Address:     [address]                                                             [place], [place]  Performed By:     Stephenie Acres        [facility]                                  [facility] at                                                             [place] [facility]                                                             [facility]  Referred By:      [facility]                    for [facility] ---------------------------------------------------------------------- Orders  #  Description                           Code        Ordered By  1  [place] MFM OB DETAIL +14 [place]               76811.01    TANYA PRATT ----------------------------------------------------------------------  #  Order #                     Accession #                Episode #  1  [id number]                   [id number]                 [id number] ---------------------------------------------------------------------- Indications  Genetic carrier (silent carrier alpha          Z14.8  thalassemia-2020; partner neg)  [redacted] weeks gestation of pregnancy                Z3A.19  Encounter for antenatal screening for          Z36.3  malformations  LR NIPS/AFP neg  2HR GTT wnl (06/11/22) ---------------------------------------------------------------------- Fetal Evaluation  Num Of Fetuses:         1  Fetal Heart  Rate(bpm):  146  Cardiac Activity:       Observed  Presentation:           Cephalic  Placenta:               Posterior  P. Cord Insertion:      Visualized, central  Amniotic Fluid  AFI FV:      Within normal limits                              Largest Pocket(cm)                              3.46 ---------------------------------------------------------------------- Biometry  BPD:      40.8  mm     G. Age:  18w 3d         19  %    CI:         68.4   %    70 - 86                                                          FL/HC:      17.1   %    16.1 - 18.3  HC:      157.7  mm     G. Age:  18w 5d         20  %    HC/AC:      1.14        1.09 - 1.39  AC:      138.2  mm     G. Age:  19w 2d         49  %    FL/BPD:     66.2   %  FL:         27  mm     G. Age:  18w 2d         14  %    FL/AC:      19.5   %    20 - 24  HUM:      26.7  mm     G. Age:  18w 3d         32  %  CER:      20.2  mm     G. Age:  19w 3d         69  %  NFT:       5.3  mm  LV:        5.1  mm  CM:        4.6  mm  Est. FW:     255  gm      0 lb 9 oz     24  % ---------------------------------------------------------------------- OB History  Blood Type:   A+  Gravidity:    3         Term:   2  Living:       2 ---------------------------------------------------------------------- Gestational Age  LMP:           19w 1d        Date:  02/09/22                 EDD:   11/16/22  U/S Today:     18w 5d                                        EDD:   11/19/22  Best:          19w 1d     Det. By:  LMP  (02/09/22)          EDD:   11/16/22 ---------------------------------------------------------------------- Anatomy  Cranium:               Appears normal         Aortic Arch:            Appears normal  Cavum:  Appears normal         Ductal Arch:            Appears normal  Ventricles:            Appears normal         Diaphragm:              Appears normal  Choroid Plexus:        Appears normal         Stomach:                Appears normal, left                                                                         sided  Cerebellum:            Appears normal         Abdomen:                Appears normal  Posterior Fossa:       Appears normal         Abdominal Wall:         Appears nml (cord                                                                        insert, abd wall)  Nuchal Fold:           Appears normal         Cord Vessels:           Appears normal (3                                                                        vessel cord)  Face:                  Appears normal         Kidneys:                Appear normal                         (orbits and profile)  Lips:                  Appears normal         Bladder:                Appears normal  Thoracic:              Appears normal         Spine:                  Appears normal  Heart:                 Appears normal         Upper Extremities:      Appears normal                         (4CH, axis, and                         situs)  RVOT:                  Appears normal         Lower Extremities:      Appears normal  LVOT:                  Appears normal  Other:  Fetus appears to be female. Heels/feet, open hands/5th digits, nasal          bone, lenses, maxilla, mandible, falx, VC, 3VV and 3VTV visualized.          Technically difficult due to fetal position. ---------------------------------------------------------------------- Cervix Uterus Adnexa  Cervix  Length:            3.2  cm.  Normal appearance by transabdominal scan  Uterus  No abnormality visualized.  Right Ovary  Size(cm)     4.11   x   2.39   x  1.96      Vol(ml): 10.08  Within normal limits.  Left Ovary  Size(cm)     1.78   x   1.32   x  0.87      Vol(ml): 1.07  Within normal limits.  Cul De Sac  No free fluid seen.  Adnexa  No abnormality visualized ---------------------------------------------------------------------- Impression  G3 P2. Patient is here for fetal anatomy scan. On cell-free  fetal DNA screening, the risks of aneuploidies are  not  increased. MSAFP screening showed low risk for open-neural  tube defects.  Obstetrical history significant for 2 term vaginal deliveries.  We performed fetal anatomy scan. No makers of  aneuploidies or fetal structural defects are seen. Fetal  biometry is consistent with her previously-established dates.  Amniotic fluid is normal and good fetal activity is seen.  Patient understands the limitations of ultrasound in detecting  fetal anomalies.  Patient is a silent carrier for alpha thalassemia and her  partner screened negative. ---------------------------------------------------------------------- Recommendations  Follow-up scans as clinically indicated. ----------------------------------------------------------------------                 Tama High, MD Electronically Signed Final Report   06/23/2022 11:02 am ----------------------------------------------------------------------   Assessment and Plan:  Pregnancy: [id number] at [redacted]w[redacted]d 1. Supervision of other normal pregnancy, antepartum Doing well AFP negative  2. [redacted] weeks gestation of pregnancy Negative early 2hr GTT   Preterm labor symptoms and general obstetric precautions including but not limited to vaginal bleeding, contractions, leaking of fluid and fetal movement were reviewed in detail with the patient. I discussed the assessment and treatment plan with the patient. The patient was provided an opportunity to ask questions and all were answered. The patient agreed with the plan and demonstrated an understanding of the instructions. The patient was advised to call back or seek an in-person office evaluation/go to [facility] at [facility] for any urgent or concerning symptoms. Please refer to After Visit Summary for other counseling recommendations.   I provided 5 minutes of face-to-face time during this  encounter.  No follow-ups on file.  Future Appointments  Date Time Provider [place]  07/28/2022 10:55 AM Griffin Basil, MD [facility]    Darliss Cheney, [place] for [facility] [place]

## 2022-07-28 ENCOUNTER — Other Ambulatory Visit: Payer: Self-pay

## 2022-07-28 ENCOUNTER — Ambulatory Visit (INDEPENDENT_AMBULATORY_CARE_PROVIDER_SITE_OTHER): Payer: Medicaid Other | Admitting: Obstetrics and Gynecology

## 2022-07-28 VITALS — BP 114/74 | HR 81 | Wt 160.3 lb

## 2022-07-28 DIAGNOSIS — D563 Thalassemia minor: Secondary | ICD-10-CM

## 2022-07-28 DIAGNOSIS — Z3A24 24 weeks gestation of pregnancy: Secondary | ICD-10-CM

## 2022-07-28 DIAGNOSIS — Z8619 Personal history of other infectious and parasitic diseases: Secondary | ICD-10-CM

## 2022-07-28 DIAGNOSIS — Z3480 Encounter for supervision of other normal pregnancy, unspecified trimester: Secondary | ICD-10-CM

## 2022-07-28 NOTE — Progress Notes (Signed)
   PRENATAL VISIT NOTE  Subjective:  Sally Crosby is a 32 y.o. G3P2002 at [redacted]w[redacted]d being seen today for ongoing prenatal care.  She is currently monitored for the following issues for this low-risk pregnancy and has History of herpes genitalis; Alpha thalassemia silent carrier; H/O eczema; Hx Type 3a perineal laceration; Anxiety; Essential hypertension; Supervision of other normal pregnancy, antepartum; and ADD (attention deficit disorder) without hyperactivity on their problem list.  Patient doing well with no acute concerns today. She reports  some mild to moderate tailbone pain .  Contractions: Not present. Vag. Bleeding: None.  Movement: Present. Denies leaking of fluid.   The following portions of the patient's history were reviewed and updated as appropriate: allergies, current medications, past family history, past medical history, past social history, past surgical history and problem list. Problem list updated.  Objective:   Vitals:   07/28/22 1115  BP: 114/74  Pulse: 81  Weight: 160 lb 4.8 oz (72.7 kg)    Fetal Status: Fetal Heart Rate (bpm): 152 Fundal Height: 25 cm Movement: Present     General:  Alert, oriented and cooperative. Patient is in no acute distress.  Skin: Skin is warm and dry. No rash noted.   Cardiovascular: Normal heart rate noted  Respiratory: Normal respiratory effort, no problems with respiration noted  Abdomen: Soft, gravid, appropriate for gestational age.  Pain/Pressure: Absent     Pelvic: Cervical exam deferred        Extremities: Normal range of motion.  Edema: None  Mental Status:  Normal mood and affect. Normal behavior. Normal judgment and thought content.   Assessment and Plan:  Pregnancy: G3P2002 at [redacted]w[redacted]d  1. [redacted] weeks gestation of pregnancy   2. Hx Type 3a perineal laceration Be aware at time of delivery  3. Supervision of other normal pregnancy, antepartum Continue routine prenatal care  4. History of herpes genitalis Prophylaxis at  36 weeks  5. Alpha thalassemia silent carrier   Preterm labor symptoms and general obstetric precautions including but not limited to vaginal bleeding, contractions, leaking of fluid and fetal movement were reviewed in detail with the patient.  Please refer to After Visit Summary for other counseling recommendations.   Return in about 4 weeks (around 08/25/2022) for ROB, in person, 2 hr GTT, 3rd trim labs.   Mariel Aloe, MD Faculty Attending [facility]

## 2022-07-30 ENCOUNTER — Encounter: Payer: Medicaid Other | Admitting: Obstetrics & Gynecology

## 2022-08-04 ENCOUNTER — Other Ambulatory Visit: Payer: Self-pay

## 2022-08-04 MED ORDER — TERCONAZOLE 0.4 % VA CREA: 1.0000 | TOPICAL_CREAM | Freq: Every day | VAGINAL | 0 refills | Status: DC

## 2022-08-31 ENCOUNTER — Encounter: Payer: Self-pay | Admitting: Obstetrics and Gynecology

## 2022-09-01 ENCOUNTER — Other Ambulatory Visit: Payer: Self-pay | Admitting: General Practice

## 2022-09-01 ENCOUNTER — Other Ambulatory Visit: Payer: Self-pay

## 2022-09-01 ENCOUNTER — Ambulatory Visit: Payer: Medicaid Other | Admitting: Obstetrics and Gynecology

## 2022-09-01 ENCOUNTER — Other Ambulatory Visit: Payer: Medicaid Other

## 2022-09-01 VITALS — BP 127/76 | HR 85 | Wt 163.0 lb

## 2022-09-01 DIAGNOSIS — Z3480 Encounter for supervision of other normal pregnancy, unspecified trimester: Secondary | ICD-10-CM

## 2022-09-01 DIAGNOSIS — Z3A28 28 weeks gestation of pregnancy: Secondary | ICD-10-CM

## 2022-09-01 DIAGNOSIS — Z8759 Personal history of other complications of pregnancy, childbirth and the puerperium: Secondary | ICD-10-CM

## 2022-09-01 NOTE — Progress Notes (Signed)
   PRENATAL VISIT NOTE  Subjective:  Sally Crosby is a 32 y.o. G3P2002 at [redacted]w[redacted]d being seen today for ongoing prenatal care.  She is currently monitored for the following issues for this low-risk pregnancy and has History of herpes genitalis; Alpha thalassemia silent carrier; H/O eczema; Hx Type 3a perineal laceration; Anxiety; History of gestational hypertension; Supervision of other normal pregnancy, antepartum; and ADD (attention deficit disorder) without hyperactivity on their problem list.  Patient reports no complaints.  Contractions: Not present. Vag. Bleeding: None.  Movement: Present. Denies leaking of fluid.   The following portions of the patient's history were reviewed and updated as appropriate: allergies, current medications, past family history, past medical history, past social history, past surgical history and problem list.   Objective:   Vitals:   09/01/22 0848  BP: 127/76  Pulse: 85  Weight: 163 lb (73.9 kg)    Fetal Status: Fetal Heart Rate (bpm): 141 Fundal Height: 28 cm Movement: Present     General:  Alert, oriented and cooperative. Patient is in no acute distress.  Skin: Skin is warm and dry. No rash noted.   Cardiovascular: Normal heart rate noted  Respiratory: Normal respiratory effort, no problems with respiration noted  Abdomen: Soft, gravid, appropriate for gestational age.  Pain/Pressure: Absent     Pelvic: Cervical exam deferred        Extremities: Normal range of motion.  Edema: None  Mental Status: Normal mood and affect. Normal behavior. Normal judgment and thought content.   Assessment and Plan:  Pregnancy: G3P2002 at [redacted]w[redacted]d 1. [redacted] weeks gestation of pregnancy 28wk labs today. Weight good  2. History of gestational hypertension BPs normal. Too late for low dose aspirin  Preterm labor symptoms and general obstetric precautions including but not limited to vaginal bleeding, contractions, leaking of fluid and fetal movement were reviewed in detail  with the patient. Please refer to After Visit Summary for other counseling recommendations.   Return in about 2 weeks (around 09/15/2022) for low risk ob, in person, md or app.  No future appointments.  [place] Bing, MD

## 2022-09-02 LAB — CBC
Hematocrit: 35.8 % (ref 34.0–46.6)
Hemoglobin: 11.6 g/dL (ref 11.1–15.9)
MCH: 27 pg (ref 26.6–33.0)
MCHC: 32.4 g/dL (ref 31.5–35.7)
MCV: 83 fL (ref 79–97)
Platelets: 185 10*3/uL (ref 150–450)
RBC: 4.29 x10E6/uL (ref 3.77–5.28)
RDW: 14 % (ref 11.7–15.4)
WBC: 6.7 10*3/uL (ref 3.4–10.8)

## 2022-09-02 LAB — HIV ANTIBODY (ROUTINE TESTING W REFLEX): HIV Screen 4th Generation wRfx: NONREACTIVE

## 2022-09-02 LAB — GLUCOSE TOLERANCE, 2 HOURS W/ 1HR
Glucose, 1 hour: 152 mg/dL (ref 70–179)
Glucose, 2 hour: 109 mg/dL (ref 70–152)
Glucose, Fasting: 85 mg/dL (ref 70–91)

## 2022-09-02 LAB — RPR: RPR Ser Ql: NONREACTIVE

## 2022-09-22 ENCOUNTER — Other Ambulatory Visit: Payer: Self-pay

## 2022-09-22 ENCOUNTER — Ambulatory Visit (INDEPENDENT_AMBULATORY_CARE_PROVIDER_SITE_OTHER): Payer: Medicaid Other | Admitting: Obstetrics and Gynecology

## 2022-09-22 VITALS — BP 118/75 | HR 85 | Wt 165.0 lb

## 2022-09-22 DIAGNOSIS — Z3483 Encounter for supervision of other normal pregnancy, third trimester: Secondary | ICD-10-CM

## 2022-09-22 DIAGNOSIS — Z23 Encounter for immunization: Secondary | ICD-10-CM

## 2022-09-22 DIAGNOSIS — Z8759 Personal history of other complications of pregnancy, childbirth and the puerperium: Secondary | ICD-10-CM

## 2022-09-22 DIAGNOSIS — Z3A32 32 weeks gestation of pregnancy: Secondary | ICD-10-CM

## 2022-09-22 DIAGNOSIS — Z3480 Encounter for supervision of other normal pregnancy, unspecified trimester: Secondary | ICD-10-CM

## 2022-09-22 DIAGNOSIS — Z8619 Personal history of other infectious and parasitic diseases: Secondary | ICD-10-CM

## 2022-09-22 MED ORDER — VITAFOL GUMMIES 3.33-0.333-34.8 MG PO CHEW
1.0000 | CHEWABLE_TABLET | Freq: Every day | ORAL | 1 refills | Status: DC
Start: 2022-09-22 — End: 2022-09-23

## 2022-09-22 NOTE — Progress Notes (Signed)
   PRENATAL VISIT NOTE  Subjective:  Sally Crosby is a 32 y.o. G3P2002 at [redacted]w[redacted]d being seen today for ongoing prenatal care.  She is currently monitored for the following issues for this low-risk pregnancy and has History of herpes genitalis; Alpha thalassemia silent carrier; H/O eczema; Hx Type 3a perineal laceration; Anxiety; History of gestational hypertension; Supervision of other normal pregnancy, antepartum; and ADD (attention deficit disorder) without hyperactivity on their problem list.  Patient reports no complaints.  Contractions: Irritability. Vag. Bleeding: None.  Movement: Present. Denies leaking of fluid.   The following portions of the patient's history were reviewed and updated as appropriate: allergies, current medications, past family history, past medical history, past social history, past surgical history and problem list.   Objective:   Vitals:   09/22/22 1130  BP: 118/75  Pulse: 85  Weight: 165 lb (74.8 kg)    Fetal Status: Fetal Heart Rate (bpm): 148 Fundal Height: 32 cm Movement: Present     General:  Alert, oriented and cooperative. Patient is in no acute distress.  Skin: Skin is warm and dry. No rash noted.   Cardiovascular: Normal heart rate noted  Respiratory: Normal respiratory effort, no problems with respiration noted  Abdomen: Soft, gravid, appropriate for gestational age.  Pain/Pressure: Present     Pelvic: Cervical exam deferred        Extremities: Normal range of motion.  Edema: None  Mental Status: Normal mood and affect. Normal behavior. Normal judgment and thought content.   Assessment and Plan:  Pregnancy: G3P2002 at [redacted]w[redacted]d 1. Supervision of other normal pregnancy, antepartum - Prenatal Vit-Fe Phos-FA-Omega (VITAFOL GUMMIES) 3.33-0.333-34.8 MG CHEW; Chew 1 tablet by mouth daily.  Dispense: 90 tablet; Refill: 1 - Tdap vaccine greater than or equal to 7yo IM  2. [redacted] weeks gestation of pregnancy  3. History of herpes genitalis Pt amenable to  starting ppx next visit  4. History of gestational hypertension No issues  Preterm labor symptoms and general obstetric precautions including but not limited to vaginal bleeding, contractions, leaking of fluid and fetal movement were reviewed in detail with the patient. Please refer to After Visit Summary for other counseling recommendations.   Return in about 2 weeks (around 10/06/2022) for in person, low risk ob, md or app.  Future Appointments  Date Time Provider Department Center  10/06/2022 10:55 AM [place] Bing, MD [facility]    [place] Bing, MD

## 2022-09-23 ENCOUNTER — Other Ambulatory Visit: Payer: Self-pay

## 2022-09-23 DIAGNOSIS — Z3480 Encounter for supervision of other normal pregnancy, unspecified trimester: Secondary | ICD-10-CM

## 2022-09-23 MED ORDER — VITAFOL GUMMIES 3.33-0.333-34.8 MG PO CHEW
3.0000 | CHEWABLE_TABLET | Freq: Every day | ORAL | 3 refills | Status: DC
Start: 2022-09-23 — End: 2024-02-12

## 2022-10-06 ENCOUNTER — Ambulatory Visit (INDEPENDENT_AMBULATORY_CARE_PROVIDER_SITE_OTHER): Payer: Medicaid Other | Admitting: Obstetrics and Gynecology

## 2022-10-06 ENCOUNTER — Other Ambulatory Visit (HOSPITAL_COMMUNITY)
Admission: RE | Admit: 2022-10-06 | Discharge: 2022-10-06 | Disposition: A | Discharge status: Home / Self Care | Payer: Medicaid Other | Source: Ambulatory Visit | Attending: Certified Nurse Midwife | Admitting: Certified Nurse Midwife

## 2022-10-06 ENCOUNTER — Other Ambulatory Visit: Payer: Self-pay

## 2022-10-06 ENCOUNTER — Other Ambulatory Visit: Payer: Self-pay | Admitting: Obstetrics and Gynecology

## 2022-10-06 VITALS — BP 114/67 | HR 92 | Wt 164.5 lb

## 2022-10-06 DIAGNOSIS — N898 Other specified noninflammatory disorders of vagina: Secondary | ICD-10-CM | POA: Diagnosis present

## 2022-10-06 DIAGNOSIS — Z872 Personal history of diseases of the skin and subcutaneous tissue: Secondary | ICD-10-CM

## 2022-10-06 DIAGNOSIS — Z3A34 34 weeks gestation of pregnancy: Secondary | ICD-10-CM

## 2022-10-06 MED ORDER — TRIAMCINOLONE ACETONIDE 0.1 % EX CREA
TOPICAL_CREAM | CUTANEOUS | 1 refills | Status: DC
Start: 2022-10-06 — End: 2022-10-31

## 2022-10-06 MED ORDER — VALACYCLOVIR HCL 500 MG PO TABS
500.0000 mg | ORAL_TABLET | Freq: Two times a day (BID) | ORAL | 0 refills | Status: DC
Start: 2022-10-06 — End: 2022-11-27

## 2022-10-06 MED ORDER — TERCONAZOLE 0.4 % VA CREA
1.0000 | TOPICAL_CREAM | Freq: Every day | VAGINAL | 0 refills | Status: DC
Start: 2022-10-06 — End: 2022-11-19

## 2022-10-06 NOTE — Progress Notes (Signed)
   PRENATAL VISIT NOTE  Subjective:  Sally Crosby is a 32 y.o. G3P2002 at [redacted]w[redacted]d being seen today for ongoing prenatal care.  She is currently monitored for the following issues for this low-risk pregnancy and has History of herpes genitalis; Alpha thalassemia silent carrier; H/O eczema; Hx Type 3a perineal laceration; Anxiety; History of gestational hypertension; Supervision of other normal pregnancy, antepartum; and ADD (attention deficit disorder) without hyperactivity on their problem list.  Patient reports no complaints.  Contractions: Irritability. Vag. Bleeding: None.  Movement: Present. Denies leaking of fluid.   The following portions of the patient's history were reviewed and updated as appropriate: allergies, current medications, past family history, past medical history, past social history, past surgical history and problem list.   Objective:   Vitals:   10/06/22 1112  BP: 114/67  Pulse: 92  Weight: 164 lb 8 oz (74.6 kg)    Fetal Status: Fetal Heart Rate (bpm): 148 Fundal Height: 33 cm Movement: Present  Presentation: Vertex  General:  Alert, oriented and cooperative. Patient is in no acute distress.  Skin: Skin is warm and dry. No rash noted.   Cardiovascular: Normal heart rate noted  Respiratory: Normal respiratory effort, no problems with respiration noted  Abdomen: Soft, gravid, appropriate for gestational age.  Pain/Pressure: Absent     Pelvic: Cervical exam deferred        Extremities: Normal range of motion.  Edema: None  Mental Status: Normal mood and affect. Normal behavior. Normal judgment and thought content.   Assessment and Plan:  Pregnancy: G3P2002 at [redacted]w[redacted]d 1. Vaginal itching - terconazole (TERAZOL 7) 0.4 % vaginal cream; Place 1 applicator vaginally at bedtime.  Dispense: 45 g; Refill: 0 - Cervicovaginal ancillary only( [place])  2. H/O eczema - triamcinolone cream (KENALOG) 0.1 %; 1 application to affected area  Dispense: 30 g; Refill: 1  3.  History of herpes genitalis Valtrex ppx sent in   4. History of gestational hypertension No issues  5. Pregnancy GBS next visit   Preterm labor symptoms and general obstetric precautions including but not limited to vaginal bleeding, contractions, leaking of fluid and fetal movement were reviewed in detail with the patient. Please refer to After Visit Summary for other counseling recommendations.   Return in about 2 weeks (around 10/20/2022) for in person, low risk ob, md or app.  Future Appointments  Date Time Provider Department Center  10/24/2022  8:15 AM [place] Bing, MD [facility]    [place] Bing, MD

## 2022-10-07 LAB — CERVICOVAGINAL ANCILLARY ONLY
Bacterial Vaginitis (gardnerella): NEGATIVE
Candida Glabrata: NEGATIVE
Candida Vaginitis: POSITIVE — AB
Chlamydia: NEGATIVE
Comment: NEGATIVE
Comment: NEGATIVE
Comment: NEGATIVE
Comment: NEGATIVE
Comment: NEGATIVE
Comment: NORMAL
Neisseria Gonorrhea: NEGATIVE
Trichomonas: NEGATIVE

## 2022-10-08 ENCOUNTER — Encounter: Payer: Medicaid Other | Admitting: Certified Nurse Midwife

## 2022-10-24 ENCOUNTER — Other Ambulatory Visit: Payer: Self-pay

## 2022-10-24 ENCOUNTER — Ambulatory Visit (INDEPENDENT_AMBULATORY_CARE_PROVIDER_SITE_OTHER): Payer: Medicaid Other | Admitting: Obstetrics and Gynecology

## 2022-10-24 VITALS — BP 118/64 | HR 91 | Wt 167.0 lb

## 2022-10-24 DIAGNOSIS — Z8759 Personal history of other complications of pregnancy, childbirth and the puerperium: Secondary | ICD-10-CM

## 2022-10-24 DIAGNOSIS — Z3480 Encounter for supervision of other normal pregnancy, unspecified trimester: Secondary | ICD-10-CM

## 2022-10-24 DIAGNOSIS — Z3A36 36 weeks gestation of pregnancy: Secondary | ICD-10-CM

## 2022-10-24 DIAGNOSIS — Z8619 Personal history of other infectious and parasitic diseases: Secondary | ICD-10-CM

## 2022-10-24 DIAGNOSIS — Z3483 Encounter for supervision of other normal pregnancy, third trimester: Secondary | ICD-10-CM

## 2022-10-24 NOTE — Progress Notes (Addendum)
   PRENATAL VISIT NOTE  Subjective:  Sally Crosby is a 32 y.o. G3P2002 at [redacted]w[redacted]d being seen today for ongoing prenatal care.  She is currently monitored for the following issues for this low-risk pregnancy and has History of herpes genitalis; Alpha thalassemia silent carrier; H/O eczema; Hx Type 3a perineal laceration; Anxiety; History of gestational hypertension; Supervision of other normal pregnancy, antepartum; and ADD (attention deficit disorder) without hyperactivity on their problem list.  Patient reports no complaints.  Contractions: Not present. Vag. Bleeding: None.  Movement: Present. Denies leaking of fluid.   The following portions of the patient's history were reviewed and updated as appropriate: allergies, current medications, past family history, past medical history, past social history, past surgical history and problem list.   Objective:   Vitals:   10/24/22 0816  BP: 118/64  Pulse: 91  Weight: 167 lb (75.8 kg)    Fetal Status: Fetal Heart Rate (bpm): 151 Fundal Height: 35 cm Movement: Present  Presentation: Vertex  General:  Alert, oriented and cooperative. Patient is in no acute distress.  Skin: Skin is warm and dry. No rash noted.   Cardiovascular: Normal heart rate noted  Respiratory: Normal respiratory effort, no problems with respiration noted  Abdomen: Soft, gravid, appropriate for gestational age.  Pain/Pressure: Absent     Pelvic: Cervical exam deferred        Extremities: Normal range of motion.  Edema: None  Mental Status: Normal mood and affect. Normal behavior. Normal judgment and thought content.   Assessment and Plan:  Pregnancy: G3P2002 at [redacted]w[redacted]d 1. Supervision of other normal pregnancy, antepartum Routine care. Follow up re: birth control options next visit - Culture, beta strep (group b only)  2. [redacted] weeks gestation of pregnancy  3. History of herpes genitalis On valtrex ppx  4. History of gestational hypertension No issues  Preterm labor  symptoms and general obstetric precautions including but not limited to vaginal bleeding, contractions, leaking of fluid and fetal movement were reviewed in detail with the patient. Please refer to After Visit Summary for other counseling recommendations.   No follow-ups on file.  No future appointments.  [place] Bing, MD

## 2022-10-27 ENCOUNTER — Encounter: Payer: Self-pay | Admitting: *Deleted

## 2022-10-31 ENCOUNTER — Other Ambulatory Visit: Payer: Self-pay

## 2022-10-31 ENCOUNTER — Ambulatory Visit (INDEPENDENT_AMBULATORY_CARE_PROVIDER_SITE_OTHER): Payer: Medicaid Other | Admitting: Obstetrics and Gynecology

## 2022-10-31 VITALS — BP 124/75 | HR 86 | Wt 170.1 lb

## 2022-10-31 DIAGNOSIS — Z3480 Encounter for supervision of other normal pregnancy, unspecified trimester: Secondary | ICD-10-CM

## 2022-10-31 DIAGNOSIS — Z3A37 37 weeks gestation of pregnancy: Secondary | ICD-10-CM

## 2022-10-31 DIAGNOSIS — Z3483 Encounter for supervision of other normal pregnancy, third trimester: Secondary | ICD-10-CM

## 2022-10-31 DIAGNOSIS — Z872 Personal history of diseases of the skin and subcutaneous tissue: Secondary | ICD-10-CM

## 2022-10-31 MED ORDER — TRIAMCINOLONE ACETONIDE 0.1 % EX CREA
TOPICAL_CREAM | CUTANEOUS | 1 refills | Status: DC
Start: 2022-10-31 — End: 2022-11-19

## 2022-10-31 NOTE — Progress Notes (Signed)
   PRENATAL VISIT NOTE  Subjective:  Sally Crosby is a 32 y.o. G3P2002 at [redacted]w[redacted]d being seen today for ongoing prenatal care.  She is currently monitored for the following issues for this low-risk pregnancy and has History of herpes genitalis; Alpha thalassemia silent carrier; H/O eczema; Hx Type 3a perineal laceration; Anxiety; History of gestational hypertension; Supervision of other normal pregnancy, antepartum; and ADD (attention deficit disorder) without hyperactivity on their problem list.  Patient reports occasional contractions.  Contractions: Irritability. Vag. Bleeding: None.  Movement: Present. Denies leaking of fluid.   The following portions of the patient's history were reviewed and updated as appropriate: allergies, current medications, past family history, past medical history, past social history, past surgical history and problem list.   Objective:   Vitals:   10/31/22 1000  BP: 124/75  Pulse: 86  Weight: 170 lb 1.6 oz (77.2 kg)    Fetal Status: Fetal Heart Rate (bpm): 145 Fundal Height: 36 cm Movement: Present  Presentation: Vertex  General:  Alert, oriented and cooperative. Patient is in no acute distress.  Skin: Skin is warm and dry. No rash noted.   Cardiovascular: Normal heart rate noted  Respiratory: Normal respiratory effort, no problems with respiration noted  Abdomen: Soft, gravid, appropriate for gestational age.  Pain/Pressure: Absent     Pelvic: Cervical exam performed in the presence of a chaperone Dilation: 1 Effacement (%): 20 Station: Ballotable  Extremities: Normal range of motion.  Edema: None  Mental Status: Normal mood and affect. Normal behavior. Normal judgment and thought content.   Assessment and Plan:  Pregnancy: G3P2002 at [redacted]w[redacted]d 1. Supervision of other normal pregnancy, antepartum GBS neg. Lacational amenorrhea>>vasectomy  2. [redacted] weeks gestation of pregnancy   3. History of herpes genitalis On valtrex ppx   4. History of gestational  hypertension No issues  Preterm labor symptoms and general obstetric precautions including but not limited to vaginal bleeding, contractions, leaking of fluid and fetal movement were reviewed in detail with the patient. Please refer to After Visit Summary for other counseling recommendations.   Return in about 1 week (around 11/07/2022) for in person, low risk ob, md or app.  No future appointments.  [place] Bing, MD

## 2022-11-03 ENCOUNTER — Encounter: Payer: Self-pay | Admitting: *Deleted

## 2022-11-10 ENCOUNTER — Ambulatory Visit (INDEPENDENT_AMBULATORY_CARE_PROVIDER_SITE_OTHER): Payer: Medicaid Other | Admitting: Obstetrics and Gynecology

## 2022-11-10 ENCOUNTER — Other Ambulatory Visit: Payer: Self-pay

## 2022-11-10 VITALS — BP 126/75 | HR 87 | Wt 170.3 lb

## 2022-11-10 DIAGNOSIS — Z3480 Encounter for supervision of other normal pregnancy, unspecified trimester: Secondary | ICD-10-CM

## 2022-11-10 DIAGNOSIS — N898 Other specified noninflammatory disorders of vagina: Secondary | ICD-10-CM

## 2022-11-10 DIAGNOSIS — Z3A39 39 weeks gestation of pregnancy: Secondary | ICD-10-CM

## 2022-11-10 DIAGNOSIS — Z3483 Encounter for supervision of other normal pregnancy, third trimester: Secondary | ICD-10-CM

## 2022-11-10 MED ORDER — FLUCONAZOLE 150 MG PO TABS
150.0000 mg | ORAL_TABLET | Freq: Once | ORAL | 0 refills | Status: AC
Start: 2022-11-10 — End: 2022-11-10

## 2022-11-10 NOTE — Progress Notes (Addendum)
   PRENATAL VISIT NOTE  Subjective:  Sally Crosby is a 32 y.o. G3P2002 at [redacted]w[redacted]d being seen today for ongoing prenatal care.  She is currently monitored for the following issues for this low-risk pregnancy and has History of herpes genitalis; Alpha thalassemia silent carrier; H/O eczema; Hx Type 3a perineal laceration; Anxiety; History of gestational hypertension; Supervision of other normal pregnancy, antepartum; and ADD (attention deficit disorder) without hyperactivity on their problem list.  Patient reports  vaginal itching .  Contractions: Irritability. Vag. Bleeding: None.  Movement: Present. Denies leaking of fluid.   Requesting one time diflucan for persistent vaginal itching   The following portions of the patient's history were reviewed and updated as appropriate: allergies, current medications, past family history, past medical history, past social history, past surgical history and problem list.   Objective:   Vitals:   11/10/22 1519  BP: 126/75  Pulse: 87  Weight: 170 lb 4.8 oz (77.2 kg)    Fetal Status: Fetal Heart Rate (bpm): 153   Movement: Present     General:  Alert, oriented and cooperative. Patient is in no acute distress.  Skin: Skin is warm and dry. No rash noted.   Cardiovascular: Normal heart rate noted  Respiratory: Normal respiratory effort, no problems with respiration noted  Abdomen: Soft, gravid, appropriate for gestational age.  Pain/Pressure: Absent     Pelvic: Cervical exam performed in the presence of a chaperone Dilation: 1 Effacement (%): 20 Station: Ballotable  Extremities: Normal range of motion.     Mental Status: Normal mood and affect. Normal behavior. Normal judgment and thought content.   Assessment and Plan:  Pregnancy: G3P2002 at [redacted]w[redacted]d 1. [redacted] weeks gestation of pregnancy FHR wnl  PD IOL scheduled for 8/28  2. Supervision of other normal pregnancy, antepartum Cervical exam completed, 1cm dilated unable to sweep membranes due to  discomfort  3. Vaginal itching - fluconazole (DIFLUCAN) 150 MG tablet; Take 1 tablet (150 mg total) by mouth once for 1 dose. Can take additional dose three days later if symptoms persist  Dispense: 1 tablet; Refill: 0   Term labor symptoms and general obstetric precautions including but not limited to vaginal bleeding, contractions, leaking of fluid and fetal movement were reviewed in detail with the patient. Please refer to After Visit Summary for other counseling recommendations.   No follow-ups on file.  Future Appointments  Date Time Provider Department Center  11/18/2022  9:15 AM Lorriane Shire, MD [facility]  11/26/2022  7:15 AM [facility] SCHED ROOM [facility] None    Lorriane Shire, MD

## 2022-11-10 NOTE — Progress Notes (Signed)
Patient would like to try Diflucan for ongoing yeast infection.  Report "light" contractions, denies any pain, VB or LOF. Wants membranes sweep today

## 2022-11-18 ENCOUNTER — Encounter: Payer: Medicaid Other | Admitting: Obstetrics and Gynecology

## 2022-11-19 ENCOUNTER — Other Ambulatory Visit: Payer: Self-pay | Admitting: Advanced Practice Midwife

## 2022-11-19 ENCOUNTER — Other Ambulatory Visit: Payer: Self-pay

## 2022-11-19 ENCOUNTER — Ambulatory Visit (INDEPENDENT_AMBULATORY_CARE_PROVIDER_SITE_OTHER): Payer: Medicaid Other | Admitting: Family Medicine

## 2022-11-19 DIAGNOSIS — Z8759 Personal history of other complications of pregnancy, childbirth and the puerperium: Secondary | ICD-10-CM

## 2022-11-19 DIAGNOSIS — Z3A40 40 weeks gestation of pregnancy: Secondary | ICD-10-CM

## 2022-11-19 DIAGNOSIS — Z3480 Encounter for supervision of other normal pregnancy, unspecified trimester: Secondary | ICD-10-CM

## 2022-11-19 DIAGNOSIS — Z8619 Personal history of other infectious and parasitic diseases: Secondary | ICD-10-CM

## 2022-11-19 DIAGNOSIS — Z3483 Encounter for supervision of other normal pregnancy, third trimester: Secondary | ICD-10-CM

## 2022-11-19 NOTE — Progress Notes (Signed)
   PRENATAL VISIT NOTE  Subjective:  Dina Rich' Wallock is a 32 y.o. G3P2002 at [redacted]w[redacted]d being seen today for ongoing prenatal care.  She is currently monitored for the following issues for this low-risk pregnancy and has History of herpes genitalis; Alpha thalassemia silent carrier; H/O eczema; Hx Type 3a perineal laceration; Anxiety; History of gestational hypertension; Supervision of other normal pregnancy, antepartum; and ADD (attention deficit disorder) without hyperactivity on their problem list.  Patient reports no complaints.  Contractions: Irregular. Vag. Bleeding: None.  Movement: Present. Denies leaking of fluid.   The following portions of the patient's history were reviewed and updated as appropriate: allergies, current medications, past family history, past medical history, past social history, past surgical history and problem list.   Objective:   Vitals:   11/19/22 0845 11/19/22 0849  BP: (!) 138/91 137/85  Pulse: 98 90  Weight: 171 lb (77.6 kg)     Fetal Status: Fetal Heart Rate (bpm): 148 Fundal Height: 40 cm Movement: Present  Presentation: Vertex  General:  Alert, oriented and cooperative. Patient is in no acute distress.  Skin: Skin is warm and dry. No rash noted.   Cardiovascular: Normal heart rate noted  Respiratory: Normal respiratory effort, no problems with respiration noted  Abdomen: Soft, gravid, appropriate for gestational age.  Pain/Pressure: Present     Pelvic: Cervical exam performed in the presence of a chaperone Dilation: 1.5 Effacement (%): Thick Station: -3  Extremities: Normal range of motion.     Mental Status: Normal mood and affect. Normal behavior. Normal judgment and thought content.   Assessment and Plan:  Pregnancy: G3P2002 at [redacted]w[redacted]d 1. Hx Type 3a perineal laceration Keep in mind at delivery  2. Supervision of other normal pregnancy, antepartum Discussed membrane sweeping today. Reviewed cochrane review data on membrane sweeping at 39 wks and  then at EDD. Reviewed risk of cramping, contractions, bleeding and ROM. Answered patient questions and she agreed to proceed with procedure.  Has IOL scheduled 8/28 Reviewed when to go to hospital Vigorous movement today Encouraged spinning babies to help with fetal rotation, feels OP on sweeping  3. History of gestational hypertension BP wnl today  4. History of herpes genitalis On Valtrex No sx today  Preterm labor symptoms and general obstetric precautions including but not limited to vaginal bleeding, contractions, leaking of fluid and fetal movement were reviewed in detail with the patient. Please refer to After Visit Summary for other counseling recommendations.   No follow-ups on file.  Future Appointments  Date Time Provider Department Center  11/26/2022  7:15 AM [facility] SCHED ROOM [facility] None    Federico Flake, MD

## 2022-11-24 ENCOUNTER — Telehealth (HOSPITAL_COMMUNITY): Payer: Self-pay | Admitting: *Deleted

## 2022-11-24 ENCOUNTER — Encounter (HOSPITAL_COMMUNITY): Payer: Self-pay | Admitting: *Deleted

## 2022-11-24 NOTE — Telephone Encounter (Signed)
Preadmission screen  

## 2022-11-26 ENCOUNTER — Encounter (HOSPITAL_COMMUNITY): Payer: Self-pay | Admitting: Obstetrics and Gynecology

## 2022-11-26 ENCOUNTER — Inpatient Hospital Stay (HOSPITAL_COMMUNITY): Payer: Medicaid Other

## 2022-11-26 ENCOUNTER — Inpatient Hospital Stay (HOSPITAL_COMMUNITY): Payer: Medicaid Other | Admitting: Anesthesiology

## 2022-11-26 ENCOUNTER — Inpatient Hospital Stay (HOSPITAL_COMMUNITY)
Admission: RE | Admit: 2022-11-26 | Discharge: 2022-11-27 | DRG: 806 | Disposition: A | Discharge status: Home / Self Care | Payer: Medicaid Other | Attending: Family Medicine | Admitting: Family Medicine

## 2022-11-26 ENCOUNTER — Other Ambulatory Visit: Payer: Self-pay

## 2022-11-26 DIAGNOSIS — O165 Unspecified maternal hypertension, complicating the puerperium: Secondary | ICD-10-CM | POA: Diagnosis present

## 2022-11-26 DIAGNOSIS — Z148 Genetic carrier of other disease: Secondary | ICD-10-CM

## 2022-11-26 DIAGNOSIS — O135 Gestational [pregnancy-induced] hypertension without significant proteinuria, complicating the puerperium: Secondary | ICD-10-CM | POA: Diagnosis not present

## 2022-11-26 DIAGNOSIS — Z3A41 41 weeks gestation of pregnancy: Secondary | ICD-10-CM

## 2022-11-26 DIAGNOSIS — O9832 Other infections with a predominantly sexual mode of transmission complicating childbirth: Secondary | ICD-10-CM | POA: Diagnosis present

## 2022-11-26 DIAGNOSIS — Z3490 Encounter for supervision of normal pregnancy, unspecified, unspecified trimester: Principal | ICD-10-CM

## 2022-11-26 DIAGNOSIS — A6000 Herpesviral infection of urogenital system, unspecified: Secondary | ICD-10-CM | POA: Diagnosis present

## 2022-11-26 DIAGNOSIS — O480 Post-term pregnancy: Secondary | ICD-10-CM | POA: Diagnosis present

## 2022-11-26 DIAGNOSIS — D563 Thalassemia minor: Secondary | ICD-10-CM | POA: Diagnosis present

## 2022-11-26 DIAGNOSIS — Z8759 Personal history of other complications of pregnancy, childbirth and the puerperium: Secondary | ICD-10-CM

## 2022-11-26 LAB — CBC
HCT: 37.9 % (ref 36.0–46.0)
Hemoglobin: 12.5 g/dL (ref 12.0–15.0)
MCH: 27.2 pg (ref 26.0–34.0)
MCHC: 33 g/dL (ref 30.0–36.0)
MCV: 82.4 fL (ref 80.0–100.0)
Platelets: 178 10*3/uL (ref 150–400)
RBC: 4.6 MIL/uL (ref 3.87–5.11)
RDW: 14.6 % (ref 11.5–15.5)
WBC: 8.9 10*3/uL (ref 4.0–10.5)
nRBC: 0 % (ref 0.0–0.2)

## 2022-11-26 LAB — TYPE AND SCREEN
ABO/RH(D): A POS
Antibody Screen: NEGATIVE

## 2022-11-26 MED ORDER — ONDANSETRON HCL 4 MG PO TABS: 4.0000 mg | ORAL_TABLET | ORAL | Status: DC | PRN

## 2022-11-26 MED ORDER — EPHEDRINE 5 MG/ML INJ: 10.0000 mg | INTRAVENOUS | Status: DC | PRN

## 2022-11-26 MED ORDER — TETANUS-DIPHTH-ACELL PERTUSSIS 5-2.5-18.5 LF-MCG/0.5 IM SUSY: 0.5000 mL | PREFILLED_SYRINGE | Freq: Once | INTRAMUSCULAR | Status: DC

## 2022-11-26 MED ORDER — OXYCODONE-ACETAMINOPHEN 5-325 MG PO TABS: 2.0000 | ORAL_TABLET | ORAL | Status: DC | PRN

## 2022-11-26 MED ORDER — DIPHENHYDRAMINE HCL 25 MG PO CAPS: 25.0000 mg | ORAL_CAPSULE | Freq: Four times a day (QID) | ORAL | Status: DC | PRN

## 2022-11-26 MED ORDER — SENNOSIDES-DOCUSATE SODIUM 8.6-50 MG PO TABS
2.0000 | ORAL_TABLET | Freq: Every day | ORAL | Status: DC
  Administered 2022-11-27: 2 via ORAL
  Filled 2022-11-26: qty 2

## 2022-11-26 MED ORDER — PHENYLEPHRINE 80 MCG/ML (10ML) SYRINGE FOR IV PUSH (FOR BLOOD PRESSURE SUPPORT)
80.0000 ug | PREFILLED_SYRINGE | INTRAVENOUS | Status: DC | PRN
  Filled 2022-11-26: qty 10

## 2022-11-26 MED ORDER — ACETAMINOPHEN 325 MG PO TABS: 650.0000 mg | ORAL_TABLET | ORAL | Status: DC | PRN

## 2022-11-26 MED ORDER — LACTATED RINGERS IV SOLN
500.0000 mL | INTRAVENOUS | Status: DC | PRN
  Administered 2022-11-26: 500 mL via INTRAVENOUS

## 2022-11-26 MED ORDER — FENTANYL CITRATE (PF) 100 MCG/2ML IJ SOLN
INTRAMUSCULAR | Status: AC
  Filled 2022-11-26: qty 2

## 2022-11-26 MED ORDER — BENZOCAINE-MENTHOL 20-0.5 % EX AERO: 1.0000 | INHALATION_SPRAY | CUTANEOUS | Status: DC | PRN

## 2022-11-26 MED ORDER — ONDANSETRON HCL 4 MG/2ML IJ SOLN: 4.0000 mg | Freq: Four times a day (QID) | INTRAMUSCULAR | Status: DC | PRN

## 2022-11-26 MED ORDER — FLEET ENEMA RE ENEM: 1.0000 | ENEMA | RECTAL | Status: DC | PRN

## 2022-11-26 MED ORDER — PHENYLEPHRINE 80 MCG/ML (10ML) SYRINGE FOR IV PUSH (FOR BLOOD PRESSURE SUPPORT): 80.0000 ug | PREFILLED_SYRINGE | INTRAVENOUS | Status: DC | PRN

## 2022-11-26 MED ORDER — PRENATAL MULTIVITAMIN CH
1.0000 | ORAL_TABLET | Freq: Every day | ORAL | Status: DC
  Administered 2022-11-27: 1 via ORAL
  Filled 2022-11-26: qty 1

## 2022-11-26 MED ORDER — WITCH HAZEL-GLYCERIN EX PADS: 1.0000 | MEDICATED_PAD | CUTANEOUS | Status: DC | PRN

## 2022-11-26 MED ORDER — ONDANSETRON HCL 4 MG/2ML IJ SOLN: 4.0000 mg | INTRAMUSCULAR | Status: DC | PRN

## 2022-11-26 MED ORDER — OXYTOCIN-SODIUM CHLORIDE 30-0.9 UT/500ML-% IV SOLN
1.0000 m[IU]/min | INTRAVENOUS | Status: DC
  Administered 2022-11-26: 2 m[IU]/min via INTRAVENOUS

## 2022-11-26 MED ORDER — OXYCODONE-ACETAMINOPHEN 5-325 MG PO TABS: 1.0000 | ORAL_TABLET | ORAL | Status: DC | PRN

## 2022-11-26 MED ORDER — LIDOCAINE HCL (PF) 1 % IJ SOLN
INTRAMUSCULAR | Status: DC | PRN
  Administered 2022-11-26: 4 mL via EPIDURAL
  Administered 2022-11-26: 5 mL via EPIDURAL

## 2022-11-26 MED ORDER — DIPHENHYDRAMINE HCL 50 MG/ML IJ SOLN: 12.5000 mg | INTRAMUSCULAR | Status: DC | PRN

## 2022-11-26 MED ORDER — LIDOCAINE HCL (PF) 1 % IJ SOLN: 30.0000 mL | INTRAMUSCULAR | Status: DC | PRN

## 2022-11-26 MED ORDER — SOD CITRATE-CITRIC ACID 500-334 MG/5ML PO SOLN: 30.0000 mL | ORAL | Status: DC | PRN

## 2022-11-26 MED ORDER — FENTANYL CITRATE (PF) 100 MCG/2ML IJ SOLN
100.0000 ug | Freq: Once | INTRAMUSCULAR | Status: AC
  Administered 2022-11-26: 100 ug via INTRAVENOUS

## 2022-11-26 MED ORDER — OXYTOCIN-SODIUM CHLORIDE 30-0.9 UT/500ML-% IV SOLN
2.5000 [IU]/h | INTRAVENOUS | Status: DC
  Filled 2022-11-26: qty 500

## 2022-11-26 MED ORDER — COCONUT OIL OIL: 1.0000 | TOPICAL_OIL | Status: DC | PRN

## 2022-11-26 MED ORDER — ZOLPIDEM TARTRATE 5 MG PO TABS: 5.0000 mg | ORAL_TABLET | Freq: Every evening | ORAL | Status: DC | PRN

## 2022-11-26 MED ORDER — LACTATED RINGERS IV SOLN: 500.0000 mL | Freq: Once | INTRAVENOUS | Status: DC

## 2022-11-26 MED ORDER — IBUPROFEN 600 MG PO TABS
600.0000 mg | ORAL_TABLET | Freq: Four times a day (QID) | ORAL | Status: DC
  Administered 2022-11-26 – 2022-11-27 (×4): 600 mg via ORAL
  Filled 2022-11-26 (×4): qty 1

## 2022-11-26 MED ORDER — FAMOTIDINE IN NACL 20-0.9 MG/50ML-% IV SOLN
20.0000 mg | Freq: Once | INTRAVENOUS | Status: AC
  Administered 2022-11-26: 20 mg via INTRAVENOUS
  Filled 2022-11-26: qty 50

## 2022-11-26 MED ORDER — FENTANYL-BUPIVACAINE-NACL 0.5-0.125-0.9 MG/250ML-% EP SOLN
12.0000 mL/h | EPIDURAL | Status: DC | PRN
  Administered 2022-11-26: 12 mL/h via EPIDURAL
  Filled 2022-11-26: qty 250

## 2022-11-26 MED ORDER — TERBUTALINE SULFATE 1 MG/ML IJ SOLN: 0.2500 mg | Freq: Once | INTRAMUSCULAR | Status: DC | PRN

## 2022-11-26 MED ORDER — DIBUCAINE (PERIANAL) 1 % EX OINT: 1.0000 | TOPICAL_OINTMENT | CUTANEOUS | Status: DC | PRN

## 2022-11-26 MED ORDER — SIMETHICONE 80 MG PO CHEW: 80.0000 mg | CHEWABLE_TABLET | ORAL | Status: DC | PRN

## 2022-11-26 MED ORDER — LACTATED RINGERS IV SOLN
INTRAVENOUS | Status: DC
  Administered 2022-11-26: 125 mL/h via INTRAVENOUS

## 2022-11-26 MED ORDER — FAMOTIDINE 20 MG PO TABS
10.0000 mg | ORAL_TABLET | Freq: Two times a day (BID) | ORAL | Status: DC
  Administered 2022-11-26 – 2022-11-27 (×2): 10 mg via ORAL
  Filled 2022-11-26 (×2): qty 1

## 2022-11-26 MED ORDER — OXYTOCIN BOLUS FROM INFUSION
333.0000 mL | Freq: Once | INTRAVENOUS | Status: AC
  Administered 2022-11-26: 333 mL via INTRAVENOUS

## 2022-11-26 NOTE — Anesthesia Procedure Notes (Signed)
Epidural Patient location during procedure: OB Start time: 11/26/2022 4:44 PM End time: 11/26/2022 4:47 PM  Staffing Anesthesiologist: Beryle Lathe, MD Performed: anesthesiologist   Preanesthetic Checklist Completed: patient identified, IV checked, risks and benefits discussed, monitors and equipment checked, pre-op evaluation and timeout performed  Epidural Patient position: sitting Prep: DuraPrep Patient monitoring: continuous pulse ox and blood pressure Approach: midline Location: L2-L3 Injection technique: LOR saline  Needle:  Needle type: Tuohy  Needle gauge: 17 G Needle length: 9 cm Needle insertion depth: 6 cm Catheter size: 19 Gauge Catheter at skin depth: 11 cm Test dose: negative and Other (1% lidocaine)  Assessment Events: blood not aspirated and no cerebrospinal fluid  Additional Notes Patient identified. Risks including, but not limited to, bleeding, infection, nerve damage, paralysis, inadequate analgesia, blood pressure changes, nausea, vomiting, allergic reaction, postpartum back pain, itching, and headache were discussed. Patient expressed understanding and wished to proceed. Sterile prep and drape, including hand hygiene, mask, and sterile gloves were used. The patient was positioned and the spine was prepped. The skin was anesthetized with lidocaine. No paraesthesia or other complication noted. The patient did not experience any signs of intravascular injection such as tinnitus or metallic taste in mouth, nor signs of intrathecal spread such as rapid motor block. Please see nursing notes for vital signs. The patient tolerated the procedure well.   Leslye Peer, MDReason for block:procedure for pain

## 2022-11-26 NOTE — H&P (Signed)
OBSTETRIC ADMISSION HISTORY AND PHYSICAL  Sally Crosby is a 32 y.o. female G3P2002 with IUP at [redacted]w[redacted]d by LMP presenting for post-term IOL.   Reports fetal movement. Denies vaginal bleeding.  She received her prenatal care at  [facility] .  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 Dating  11/16/2022, by Last Menstrual Period  [facility] Model Arly.Keller ] Traditional [ ]  Centering [ ]  Mom-Baby Dyad    Language  English Anatomy US  WNL  Flu Vaccine  In [facility]22 [facility] Genetic/Carrier Screen  NIPS:  LR Female AFP:   NEG Horizon:  TDaP Vaccine  09/22/22  Hgb A1C or  GTT Early  Third trimester  wnl  COVID Vaccine    LAB RESULTS   Rhogam  A/Positive/-- (02/05 1044)  Blood Type A/Positive/-- (02/05 1044)   Baby Feeding Plan Breast  Antibody Negative (02/05 1044)  Contraception Yes, need options  Rubella 1.48 (02/05 1044)Immune  Circumcision N/a girl  RPR Non Reactive (02/05 1044)   Pediatrician  [facility] HBsAg Negative (02/05 1044)   Support Person FOB--John HCVAb Non Reactive (02/05 1044)   Prenatal Classes  HIV Non Reactive (02/05 1044)     BTL Consent NA GBS  neg  VBAC Consent NA Pap Diagnosis  Date Value Ref Range Status  10/25/2019   Final   - Negative for intraepithelial lesion or malignancy (NILM)         DME Rx [ ]  BP cuff [ ]  Weight Scale Waterbirth  [ ]  Class [ ]  Consent [ ]  CNM visit  PHQ9 & GAD7 [  ] new OB [  ] 28 weeks  [  ] 36 weeks Induction  [ ]  Orders Entered [ ] Foley Y/N   Past Medical History: Past Medical History:  Diagnosis Date   Anxiety    Fibroids 02/17/2019   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 adolescence 07/20/2020    Past Surgical History: Past Surgical History:  Procedure Laterality Date   HERNIA REPAIR  1999    Obstetrical  History: OB History     Gravida  3   Para  2   Term  2   Preterm      AB      Living  2      SAB      IAB      Ectopic      Multiple  0   Live Births  2           Social History: Social History   Socioeconomic History   Marital status: Single    Spouse name: Not on file   Number of children: Not on file   Years of education: Not on file   Highest education level: Not on file  Occupational History   Not on file  Tobacco Use   Smoking status: Never   Smokeless tobacco: Never  Vaping Use   Vaping status: Never Used  Substance and Sexual Activity   Alcohol use: Not Currently    Comment: occasional   Drug use: Never   Sexual activity: Not Currently    Birth control/protection: None  Other Topics Concern   Not on file  Social History Narrative   Not on file   Social Determinants of Health   Financial Resource Strain: Not on file  Food Insecurity: No Food Insecurity (05/05/2022)   Hunger Vital Sign    Worried About Running Out of Food in the Last Year: Never true    Ran Out of Food in the Last Year: Never true  Transportation Needs: No Transportation Needs (05/05/2022)   PRAPARE - Administrator, Civil Service (Medical): No    Lack of Transportation (Non-Medical): No  Physical Activity: Not on file  Stress: Not on file  Social Connections: Not on file    Family History: Family History  Problem Relation Age of Onset   Hypertension Mother     Allergies: No Known Allergies  Medications Prior to Admission  Medication Sig Dispense Refill Last Dose   famotidine (PEPCID) 10 MG tablet Take 10 mg by mouth 2 (two) times daily.      Prenatal Vit-Fe Phos-FA-Omega (VITAFOL GUMMIES) 3.33-0.333-34.8 MG CHEW Chew 3 tablets by mouth daily. 90 tablet 3    valACYclovir (VALTREX) 500 MG tablet Take 1 tablet (500 mg total) by mouth 2 (two) times daily. 60 tablet 0      Review of Systems  All systems reviewed and negative except as stated in  HPI  Blood pressure 135/87, pulse 92, temperature 98.2 F (36.8 C), temperature source Oral, resp. rate 16, height 5\' 3"  (1.6 m), weight 79 kg, last menstrual period 02/09/2022, currently breastfeeding. General appearance: alert, cooperative, and no distress Lungs: no respiratory distress Heart: regular rate  Abdomen: soft, non-tender; gravid Pelvic: adequate Extremities: Homans sign is negative, no sign of DVT Presentation: cephalic Fetal monitoring: Baseline rate 140 bpm   Variability moderate  Accelerations present   Decelerations none Uterine activity: irregular with UI noted  Dilation: 5 Effacement (%): 70 Station: 0 Exam by:: CNM Lynore Coscia AROM Procedure: Verbal consent to proceed with AROM after discussion of r/b and active management of labor. Moderate amount of clear fluid in return. Patient verbalized an understanding of the plan of care and agrees. Patient tolerated the procedure well.  Prenatal labs: ABO, Rh: --/--/A POS (08/28 1413) Antibody: NEG (08/28 1413) Rubella: 1.48 (02/05 1044) IMMUNE RPR: Non Reactive (06/03 0822)  HBsAg: Negative (02/05 1044)  HIV: Non Reactive (06/03 3086)  GBS: Negative/-- (07/26 0943)  Glucola: WNL Genetic screening:  Normal  Prenatal Transfer Tool  Maternal Diabetes: No Genetic Screening: Normal Maternal Ultrasounds/Referrals: Normal Fetal Ultrasounds or other Referrals:  None Maternal Substance Abuse:  No Significant Maternal Medications:  Meds include: Other: Valtrex Significant Maternal Lab Results: Group B Strep negative  Results for orders placed or performed during the hospital encounter of 11/26/22 (from the past 24 hour(s))  CBC   Collection Time: 11/26/22  2:13 PM  Result Value Ref Range   WBC 8.9 4.0 - 10.5 K/uL   RBC 4.60 3.87 - 5.11 MIL/uL   Hemoglobin 12.5 12.0 - 15.0 g/dL   HCT 57.8 46.9 - 62.9 %   MCV 82.4 80.0 - 100.0 fL   MCH 27.2 26.0 - 34.0 pg   MCHC 33.0 30.0 - 36.0 g/dL   RDW 52.8 41.3 - 24.4 %    Platelets 178 150 - 400 K/uL   nRBC 0.0 0.0 - 0.2 %  Type and screen [facility]   Collection Time: 11/26/22  2:13 PM  Result Value Ref Range   ABO/RH(D) A POS    Antibody Screen NEG    Sample Expiration      11/29/2022,2359 Performed at [facility], [address], [place], [place] [address]  Patient Active Problem List   Diagnosis Date Noted   Pregnancy 11/26/2022   ADD (attention deficit disorder) without hyperactivity 06/02/2022   Supervision of other normal pregnancy, antepartum 04/17/2022   Anxiety 07/20/2020   History of gestational hypertension 07/20/2020   Hx Type 3a perineal laceration 07/09/2019   H/O eczema 07/06/2019   Alpha thalassemia silent carrier 02/17/2019   History of herpes genitalis 01/06/2019    Assessment/Plan:  Sally Crosby is a 32 y.o. G3P2002 at [redacted]w[redacted]d here for post-term IOL  Labor: IOL -- pain control: planning epidural  Fetal Wellbeing: EFW 7 lbs by Leopold's. Cephalic by SVE.  -- GBS (Negative) -- continuous fetal monitoring - category 1   Postpartum Planning -- breast -- Spouse planning vasectomy for contraception    Raelyn Mora, CNM  11/26/2022, 3:56 PM

## 2022-11-26 NOTE — Discharge Summary (Signed)
Postpartum Discharge Summary  Date of Service updated***     Patient Name: Sally Crosby DOB: 11-Sep-1990 MRN: [id number]  Date of admission: 11/26/2022 Delivery date:11/26/2022 Delivering provider: Raelyn Mora Date of discharge: 11/26/2022  Admitting diagnosis: Pregnancy [Z34.90] Intrauterine pregnancy: [redacted]w[redacted]d     Secondary diagnosis:  Principal Problem:   Pregnancy  Additional problems: Post-Term Pregnancy after 41 week  Discharge diagnosis: Term Pregnancy Delivered                                              Post partum procedures:{Postpartum procedures:23558} Augmentation: AROM and Pitocin Complications: None  Hospital course: Induction of Labor With Vaginal Delivery   32 y.o. yo G3P2002 at [redacted]w[redacted]d was admitted to the hospital 11/26/2022 for induction of labor.  Indication for induction: Postdates.  Patient had an labor course complicated by none. Membrane Rupture Time/Date: 2:58 PM,11/26/2022  Delivery Method:Vaginal, Spontaneous Operative Delivery:N/A Episiotomy: None Lacerations:  None Details of delivery can be found in separate delivery note.  Patient had a postpartum course complicated by***. Patient is discharged home 11/26/22.  Newborn Data: Birth date:11/26/2022 Birth time:5:48 PM Gender:Female Living status:Living Apgars:9 ,9  Weight:   Magnesium Sulfate received: {Mag received:30440022} BMZ received: {BMZ received:30440023} Rhophylac:{Rhophylac received:30440032} QMV:{HQI:69629528} T-DaP:{Tdap:23962} Flu: {UXL:24401} Transfusion:{Transfusion received:30440034}  Physical exam  Vitals:   11/26/22 1735 11/26/22 1800 11/26/22 1814 11/26/22 1830  BP: (!) 138/96  (!) 122/90 111/66  Pulse: 98  90 86  Resp: 16  18 18   Temp:  97.9 F (36.6 C)  98 F (36.7 C)  TempSrc:  Oral  Oral  SpO2:      Weight:      Height:       General: {Exam; general:21111117} Lochia: {Desc; appropriate/inappropriate:30686::"appropriate"} Uterine Fundus: {Desc;  firm/soft:30687} Incision: {Exam; incision:21111123} DVT Evaluation: {Exam; dvt:2111122} Labs: Lab Results  Component Value Date   WBC 8.9 11/26/2022   HGB 12.5 11/26/2022   HCT 37.9 11/26/2022   MCV 82.4 11/26/2022   PLT 178 11/26/2022      Latest Ref Rng & Units 09/17/2020   10:25 AM  CMP  Glucose 65 - 99 mg/dL 82    Edinburgh Score:    03/11/2021   11:46 AM  Edinburgh Postnatal Depression Scale Screening Tool  I have been able to laugh and see the funny side of things. 0  I have looked forward with enjoyment to things. 0  I have blamed myself unnecessarily when things went wrong. 0  I have been anxious or worried for no good reason. 0  I have felt scared or panicky for no good reason. 0  Things have been getting on top of me. 0  I have been so unhappy that I have had difficulty sleeping. 0  I have felt sad or miserable. 0  I have been so unhappy that I have been crying. 0  The thought of harming myself has occurred to me. 0  Edinburgh Postnatal Depression Scale Total 0     After visit meds:  Allergies as of 11/26/2022   No Known Allergies   Med Rec must be completed prior to using this [facility]***        Discharge home in stable condition Infant Feeding: {Baby feeding:23562} Infant Disposition:{CHL IP OB HOME WITH UUVOZD:66440} Discharge instruction: per After Visit Summary and Postpartum booklet. Activity: Advance as tolerated. Pelvic rest for 6 weeks.  Diet: {OB NUUV:25366440} Future Appointments:No future appointments. Follow up Visit:  Message sent to [facility] by R. Arita Miss, CNM on 11/26/2022 Please schedule this patient for a In person postpartum visit in 4 weeks with the following provider: Any provider. Additional Postpartum F/U: none   Low risk pregnancy complicated by:  none Delivery mode:  Vaginal, Spontaneous Anticipated Birth Control:   partner planning vasectomy   11/26/2022 Raelyn Mora, CNM

## 2022-11-26 NOTE — Anesthesia Preprocedure Evaluation (Signed)
Anesthesia Evaluation  Patient identified by MRN, date of birth, ID band Patient awake    Reviewed: Allergy & Precautions, NPO status , Patient's Chart, lab work & pertinent test results  History of Anesthesia Complications Negative for: history of anesthetic complications  Airway Mallampati: II   Neck ROM: Full    Dental   Pulmonary neg pulmonary ROS   Pulmonary exam normal        Cardiovascular negative cardio ROS Normal cardiovascular exam     Neuro/Psych  PSYCHIATRIC DISORDERS Anxiety     negative neurological ROS     GI/Hepatic negative GI ROS, Neg liver ROS,,,  Endo/Other  negative endocrine ROS    Renal/GU negative Renal ROS     Musculoskeletal negative musculoskeletal ROS (+)    Abdominal   Peds  (+) ATTENTION DEFICIT DISORDER WITHOUT HYPERACTIVITY Hematology  Alpha thalassemia silent carrier Plt 178k    Anesthesia Other Findings HSV  Reproductive/Obstetrics (+) Pregnancy                             Anesthesia Physical Anesthesia Plan  ASA: 2  Anesthesia Plan: Epidural   Post-op Pain Management: Minimal or no pain anticipated   Induction:   PONV Risk Score and Plan: 2 and Treatment may vary due to age or medical condition  Airway Management Planned: Natural Airway  Additional Equipment: None  Intra-op Plan:   Post-operative Plan:   Informed Consent: I have reviewed the patients History and Physical, chart, labs and discussed the procedure including the risks, benefits and alternatives for the proposed anesthesia with the patient or authorized representative who has indicated his/her understanding and acceptance.       Plan Discussed with: Anesthesiologist  Anesthesia Plan Comments: (Labs reviewed. Platelets acceptable, patient not taking any blood thinning medications. Per RN, FHR tracing reported to be stable enough for sitting procedure. Risks and benefits  discussed with patient, including PDPH, backache, epidural hematoma, failed epidural, blood pressure changes, allergic reaction, and nerve injury. Patient expressed understanding and wished to proceed.)       Anesthesia Quick Evaluation

## 2022-11-27 ENCOUNTER — Other Ambulatory Visit (HOSPITAL_COMMUNITY): Payer: Self-pay

## 2022-11-27 DIAGNOSIS — O165 Unspecified maternal hypertension, complicating the puerperium: Secondary | ICD-10-CM | POA: Diagnosis not present

## 2022-11-27 LAB — RPR: RPR Ser Ql: NONREACTIVE

## 2022-11-27 MED ORDER — FUROSEMIDE 20 MG PO TABS
20.0000 mg | ORAL_TABLET | Freq: Every day | ORAL | Status: DC
  Administered 2022-11-27: 20 mg via ORAL
  Filled 2022-11-27: qty 1

## 2022-11-27 MED ORDER — NIFEDIPINE ER 30 MG PO TB24
30.0000 mg | ORAL_TABLET | Freq: Every day | ORAL | 3 refills | Status: DC
  Filled 2022-11-27: qty 30, 30d supply, fill #0

## 2022-11-27 MED ORDER — NIFEDIPINE ER OSMOTIC RELEASE 30 MG PO TB24
30.0000 mg | ORAL_TABLET | Freq: Every day | ORAL | Status: DC
  Administered 2022-11-27: 30 mg via ORAL
  Filled 2022-11-27: qty 1

## 2022-11-27 MED ORDER — IBUPROFEN 600 MG PO TABS
600.0000 mg | ORAL_TABLET | Freq: Four times a day (QID) | ORAL | 0 refills | Status: DC | PRN
  Filled 2022-11-27: qty 30, 8d supply, fill #0

## 2022-11-27 MED ORDER — FUROSEMIDE 20 MG PO TABS
20.0000 mg | ORAL_TABLET | Freq: Every day | ORAL | 0 refills | Status: AC
Start: 2022-11-27 — End: ?
  Filled 2022-11-27: qty 4, 4d supply, fill #0

## 2022-11-27 NOTE — Anesthesia Postprocedure Evaluation (Signed)
Anesthesia Post Note  Patient: Sally Crosby  Procedure(s) Performed: AN AD HOC LABOR EPIDURAL     Patient location during evaluation: Mother Baby Anesthesia Type: Epidural Level of consciousness: awake Pain management: satisfactory to patient Vital Signs Assessment: post-procedure vital signs reviewed and stable Respiratory status: spontaneous breathing Cardiovascular status: stable Anesthetic complications: no  No notable events documented.  Last Vitals:  Vitals:   11/27/22 0200 11/27/22 0645  BP: 120/78 130/89  Pulse: 69 80  Resp: 18 16  Temp: 37.1 C 36.8 C  SpO2: 99% 99%    Last Pain:  Vitals:   11/27/22 0645  TempSrc: Oral  PainSc:    Pain Goal:                   [employer]

## 2022-11-27 NOTE — Social Work (Signed)
CSW received consult for hx of Anxiety and Depression.  CSW met with MOB to offer support and complete assessment. CSW entered the room and observed MOB changing the infant. CSW introduced self, CSW role and reason for visit. MOB was agreeable to visit. CSW inquired about how MOB was feeling, MOB reported good just tired. CSW inquired about MOB MH hx, MOB reported she experienced Anxiety after her fist child was born, MOB reported a stable mood throughout pregnancy and no concerns. CSW assessed for safety, MOB denied any SI or HI. CSW provided education regarding the baby blues period vs. perinatal mood disorders, discussed treatment and gave resources for mental health follow up if concerns arise.  CSW recommends self-evaluation during the postpartum time period using the New Mom Checklist from Postpartum Progress and encouraged MOB to contact a medical professional if symptoms are noted at any time.  MOB identified FOB, her sister and dad.   CSW provided review of Sudden Infant Death Syndrome (SIDS) precautions.  MOB identified [facility] for infants follow up care. MOB reported they have all necessary items for the infant including a crib and car seat.  CSW identifies no further need for intervention and no barriers to discharge at this time.  Wende Neighbors, LCSWA Clinical Social Worker [phone]

## 2022-11-27 NOTE — Social Work (Signed)
CSW acknowledged consult and completed a clinical assessment.  There are no barriers to d/c.  Clinical assessment notes will be entered at a later time.  Kelsie Bowser, LCSWA Clinical Social Worker [phone]  

## 2022-11-27 NOTE — Lactation Note (Signed)
This note was copied from a baby's chart. Lactation Consultation Note  Patient Name: Girl Jullisa Naatz [id number] Date: 11/27/2022 Age:32 Reason for consult: Initial assessment;Term  Visited P3 parent for initial consult. Birth parent reports she breastfed her first two kids for 1+ years. She did not have any challenges and considers herself an experienced breastfeeding parent.   LC reviewed outpatient services and normal newborn feeding expectations. Birth parent does have a DEBP at home.  Baby "Camelia Eng" has been latching well and birth parent does not have any questions at this time. She would like to be marked PRN and will call for assistance as needed.  All questions were answered.  Feeding Plan: 1) Breastfeed baby skin to skin every 2-3 hours or sooner if baby cues. 2) Call RN for breastfeeding assistance, Maternal Data Has patient been taught Hand Expression?: Yes Does the patient have breastfeeding experience prior to this delivery?: Yes How long did the patient breastfeed?: Breastfed her first two kids for 1+ years. No challenges, experienced breastfeeding parent.  Feeding Mother's Current Feeding Choice: Breast Milk  Interventions Interventions: Breast feeding basics reviewed;Hand express;Position options;DEBP;Education;LC Services brochure  Discharge Pump: DEBP;Personal;Manual  Consult Status Consult Status: PRN    Antionette Char 11/27/2022, 1:58 PM

## 2022-12-22 ENCOUNTER — Other Ambulatory Visit: Payer: Self-pay | Admitting: Advanced Practice Midwife

## 2022-12-29 ENCOUNTER — Telehealth (HOSPITAL_COMMUNITY): Payer: Self-pay | Admitting: *Deleted

## 2022-12-29 DIAGNOSIS — Z1331 Encounter for screening for depression: Secondary | ICD-10-CM

## 2022-12-29 NOTE — Telephone Encounter (Signed)
Opened in error

## 2022-12-29 NOTE — Telephone Encounter (Signed)
12/29/2022  Name: Wreatha Sturgeon MRN: [id number] DOB: 12/06/1990  Reason for Call:  Transition of Care Hospital Discharge Call  Contact Status: Patient Contact Status: Complete  Language assistant needed: Interpreter Mode: Interpreter Not Needed        Follow-Up Questions: Do You Have Any Concerns About Your Health As You Heal From Delivery?: No Do You Have Any Concerns About Your Infants Health?: No  Edinburgh Postnatal Depression Scale:  In the Past 7 Days: I have been able to laugh and see the funny side of things.: Not quite so much now I have looked forward with enjoyment to things.: Rather less than I used to I have blamed myself unnecessarily when things went wrong.: Not very often I have been anxious or worried for no good reason.: Yes, very often I have felt scared or panicky for no good reason.: Yes, sometimes Things have been getting on top of me.: Yes, sometimes I haven't been coping as well as usual I have been so unhappy that I have had difficulty sleeping.: Yes, sometimes I have felt sad or miserable.: Not very often I have been so unhappy that I have been crying.: Only occasionally The thought of harming myself has occurred to me.: Never Edinburgh Postnatal Depression Scale Total: (!) 14  PHQ2-9 Depression Scale:     Discharge Follow-up: Edinburgh score requires follow up?: Yes Provider notified of Edinburgh score?: Yes Have you already been referred for a counseling appointment?: No Patient was advised of the following resources:: Support Group, Breastfeeding Support Group  Post-discharge interventions: Reviewed Newborn Safe Sleep Practices Maternal Mental Health Resources provided Placed [facility] referral in patient's chart  Salena Saner, RN 12/29/2022 14:04

## 2023-01-01 ENCOUNTER — Encounter: Payer: Self-pay | Admitting: Obstetrics and Gynecology

## 2023-01-01 ENCOUNTER — Ambulatory Visit (INDEPENDENT_AMBULATORY_CARE_PROVIDER_SITE_OTHER): Payer: Medicaid Other | Admitting: Obstetrics and Gynecology

## 2023-01-01 ENCOUNTER — Other Ambulatory Visit: Payer: Self-pay

## 2023-01-01 DIAGNOSIS — F53 Postpartum depression: Secondary | ICD-10-CM | POA: Insufficient documentation

## 2023-01-01 DIAGNOSIS — O165 Unspecified maternal hypertension, complicating the puerperium: Secondary | ICD-10-CM

## 2023-01-01 MED ORDER — SERTRALINE HCL 25 MG PO TABS: 25.0000 mg | ORAL_TABLET | Freq: Every day | ORAL | 0 refills | Status: DC

## 2023-01-01 MED ORDER — SERTRALINE HCL 50 MG PO TABS: ORAL_TABLET | ORAL | 5 refills | Status: DC

## 2023-01-01 MED ORDER — NIFEDIPINE ER OSMOTIC RELEASE 30 MG PO TB24
30.0000 mg | ORAL_TABLET | Freq: Every day | ORAL | 2 refills | Status: DC
Start: 2023-01-01 — End: 2023-01-09

## 2023-01-01 NOTE — Progress Notes (Signed)
    Post Partum Visit Note  Sally Crosby is a 32 y.o. G78P3003 female who presents for a postpartum visit. She is 5 weeks postpartum following a normal spontaneous vaginal delivery.  I have fully reviewed the prenatal and intrapartum course. The delivery was at 41 gestational weeks.  Anesthesia: epidural. Postpartum course was complicated by PP HTN. Baby is doing well. Baby is feeding by breast. Bleeding no bleeding. Bowel function is abnormal-hard stool. Bladder function is normal. Patient is not sexually active. Contraception method is vasectomy. Postpartum depression screening: positive.   The pregnancy intention screening data noted above was reviewed. Potential methods of contraception were discussed. The patient elected to proceed with No data recorded.    Health Maintenance Due  Topic Date Due   Cervical Cancer Screening (HPV/Pap Cotest)  10/25/2022   COVID-19 Vaccine (4 - 2023-24 season) 11/30/2022    Medical Records  Review of Systems Pertinent items are noted in HPI.  Objective:  LMP 02/09/2022    General:  alert   Breasts:  not indicated  Lungs: clear to auscultation bilaterally  Heart:  regular rate and rhythm, S1, S2 normal, no murmur, click, rub or gallop  Abdomen: soft, non-tender; bowel sounds normal; no masses,  no organomegaly   Wound NA  GU exam:  not indicated       Assessment:    1. Postpartum care following vaginal delivery NL  2. Postpartum hypertension Pt took Procardia for a few weeks PP Has been off for 2-3 weeks now   3. Postpartum Depression   Plan:   Essential components of care per ACOG recommendations:  1.  Mood and well being: Patient with positive depression screening today. Reviewed local resources for support.  - Patient tobacco use? No.   - hx of drug use? Yes. Discussed support systems and outpatient/inpatient treatment options.    2. Infant care and feeding:  -Patient currently breastmilk feeding? Yes. Discussed returning  to work and pumping.  -Social determinants of health (SDOH) reviewed in EPIC. No concerns  3. Sexuality, contraception and birth spacing - Patient does not want a pregnancy in the next year.  Desired family size is completed. - Reviewed reproductive life planning. Reviewed contraceptive methods based on pt preferences and effectiveness.  Patient desired Vasectomy today.   - Discussed birth spacing of 18 months  4. Sleep and fatigue -Encouraged family/partner/community support of 4 hrs of uninterrupted sleep to help with mood and fatigue  5. Physical Recovery  - Discussed patients delivery and complications. She describes her labor as good. - Patient had a Vaginal, no problems at delivery. Patient had a 1st degree laceration. Perineal healing reviewed. Patient expressed understanding - Patient has urinary incontinence? No. - Patient is safe to resume physical and sexual activity  6.  Health Maintenance - HM due items addressed Yes - Last pap smear  Diagnosis  Date Value Ref Range Status  10/25/2019   Final   - Negative for intraepithelial lesion or malignancy (NILM)   Pap smear not done at today's visit. Pt declined -Breast Cancer screening indicated? No.   7. Chronic Disease/Pregnancy Condition follow up: Hypertension  Will restart Procardia and refer to FM for continued management. Discussed PP depression. Pt has been referred for counseling. Discussed starting medication. Following discussion pt is agreeable to starting Zoloft. U/R/B reviewed  Pt to schedule appt for pap smear  Nettie Elm, MD [facility] for [facility], [facility]

## 2023-01-08 ENCOUNTER — Other Ambulatory Visit: Payer: Self-pay | Admitting: Obstetrics and Gynecology

## 2023-01-08 DIAGNOSIS — O165 Unspecified maternal hypertension, complicating the puerperium: Secondary | ICD-10-CM

## 2023-01-09 MED ORDER — NIFEDIPINE ER OSMOTIC RELEASE 30 MG PO TB24
30.0000 mg | ORAL_TABLET | Freq: Every day | ORAL | 2 refills | Status: DC
Start: 2023-01-09 — End: 2023-02-24

## 2023-01-09 NOTE — Telephone Encounter (Signed)
Prescription change requested for nifedipine 30 mg daily; pharmacy requesting fewer day supply. Currently written for 180 tablets for uterine contractions; will reorder 30 tablets with refills for postpartum hypertension only as patient is no longer pregnant.

## 2023-01-26 ENCOUNTER — Other Ambulatory Visit: Payer: Self-pay | Admitting: Lactation Services

## 2023-01-26 DIAGNOSIS — F53 Postpartum depression: Secondary | ICD-10-CM

## 2023-01-26 MED ORDER — SERTRALINE HCL 50 MG PO TABS: 50.0000 mg | ORAL_TABLET | Freq: Every day | ORAL | 1 refills | Status: DC

## 2023-01-26 NOTE — Progress Notes (Signed)
Received request for 90 day supply Zoloft, a change from 30 day supply . Order sent in. Called Pharmacy to cancel previous Zoloft 50 ml 30 day supply.

## 2023-02-17 ENCOUNTER — Other Ambulatory Visit (HOSPITAL_COMMUNITY): Payer: Self-pay

## 2023-02-24 ENCOUNTER — Ambulatory Visit (INDEPENDENT_AMBULATORY_CARE_PROVIDER_SITE_OTHER): Payer: Medicaid Other | Admitting: Family Medicine

## 2023-02-24 VITALS — BP 136/88 | HR 79 | Ht 63.0 in | Wt 168.1 lb

## 2023-02-24 DIAGNOSIS — F411 Generalized anxiety disorder: Secondary | ICD-10-CM | POA: Insufficient documentation

## 2023-02-24 DIAGNOSIS — Z3A00 Weeks of gestation of pregnancy not specified: Secondary | ICD-10-CM

## 2023-02-24 DIAGNOSIS — O165 Unspecified maternal hypertension, complicating the puerperium: Secondary | ICD-10-CM | POA: Diagnosis not present

## 2023-02-24 DIAGNOSIS — Z7689 Persons encountering health services in other specified circumstances: Secondary | ICD-10-CM

## 2023-02-24 DIAGNOSIS — F53 Postpartum depression: Secondary | ICD-10-CM

## 2023-02-24 MED ORDER — NIFEDIPINE ER OSMOTIC RELEASE 30 MG PO TB24
30.0000 mg | ORAL_TABLET | Freq: Every day | ORAL | 5 refills | Status: DC
Start: 2023-02-24 — End: 2024-02-23

## 2023-02-24 NOTE — Progress Notes (Signed)
New Patient Office Visit  Subjective:   Sally Crosby Jun 12, 1990 02/24/2023  Chief Complaint  Patient presents with   New Patient (Initial Visit)    Patient is here to get established with the practice. Patient was referred to PCP by OB due to postpartum HTN.    HPI: Sally Crosby presents today to establish care at [facility] at [facility]. Introduced to Publishing rights manager role and practice setting.  All questions answered.   Patient recommended to establish care per OBGYN.    HYPERTENSION: Sally Crosby presents for the medical management of hypertension.  Patient's current hypertension medication regimen is: Nifedipine 30mg  XR Patient is  currently taking prescribed medications for HTN.  Patient is  regularly keeping a check on BP at home. Reports systolic 140 and diastolic 90s with automatic arm cufff at home.  Adhering to low sodium diet: Not currently  Exercising Regularly: Not currently  Denies headache, dizziness, CP, SHOB, vision changes.    Breastfeeding: yes currently   Denies hx of HTN prior to pregnancy. She states she does have a significant family hx of HTN.   BP Readings from Last 3 Encounters:  02/24/23 136/88  01/01/23 (!) 142/103  11/27/22 132/85    POSTPARTUM DEPRESSION: Sally Crosby presents for the medical management of  postpartum depression. Patient was previously placed on Zoloft by OBGYN for symptoms of postpartum depression.  Current medication regimen: Instructed to restart Zoloft 50mg  on 01/01/23; Patient did not start due to fears of relying on medication and ongoing stressors. Tearful in exam room. Reports mild depression post birth of first child.  Counseling: Recommended Well controlled: Not currently Denies SI/HI.      02/24/2023    9:32 AM 01/01/2023    9:52 AM 05/05/2022   11:38 AM 02/04/2021   11:35 AM 01/29/2021    9:59 AM  Depression screen PHQ 2/9  Decreased Interest 1 0 0 0 0   Down, Depressed, Hopeless 0 0 0 0 0  PHQ - 2 Score 1 0 0 0 0  Altered sleeping 0 1 1 0 0  Tired, decreased energy 3 3 1 1 1   Change in appetite 1 0 1 0 0  Feeling bad or failure about yourself  1 0 0 0 0  Trouble concentrating 1 1 0 0 0  Moving slowly or fidgety/restless 0 0 0 0 0  Suicidal thoughts 0 0 0 0 0  PHQ-9 Score 7 5 3 1 1   Difficult doing work/chores Somewhat difficult          02/24/2023    9:33 AM 01/01/2023    9:54 AM 05/05/2022   11:38 AM 02/04/2021   11:35 AM  GAD 7 : Generalized Anxiety Score  Nervous, Anxious, on Edge 2 1 2 2   Control/stop worrying 1 1 0 1  Worry too much - different things 1 1 1 1   Trouble relaxing 1 1 0 0  Restless 0 1 0 0  Easily annoyed or irritable 2 1 1 1   Afraid - awful might happen 2 1 1 1   Total GAD 7 Score 9 7 5 6   Anxiety Difficulty Not difficult at all       The following portions of the patient's history were reviewed and updated as appropriate: past medical history, past surgical history, family history, social history, allergies, medications, and problem list.   Patient Active Problem List   Diagnosis Date Noted   GAD (generalized anxiety disorder) 02/24/2023  Postpartum care following vaginal delivery 01/01/2023   Postpartum depression 01/01/2023   Postpartum hypertension 11/27/2022   ADD (attention deficit disorder) without hyperactivity 06/02/2022   Anxiety 07/20/2020   History of gestational hypertension 07/20/2020   Hx Type 3a perineal laceration 07/09/2019   H/O eczema 07/06/2019   History of herpes genitalis 01/06/2019   Past Medical History:  Diagnosis Date   Alpha thalassemia silent carrier 02/17/2019   Partner is negative     Anxiety    Fibroids 02/17/2019   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 adolescence 07/20/2020   Past Surgical History:  Procedure Laterality Date   HERNIA REPAIR  1999   Family History  Problem Relation Age of Onset    Hypertension Mother    Social History   Socioeconomic History   Marital status: Single    Spouse name: Not on file   Number of children: Not on file   Years of education: Not on file   Highest education level: Bachelor's degree (e.g., BA, AB, BS)  Occupational History   Not on file  Tobacco Use   Smoking status: Never   Smokeless tobacco: Never  Vaping Use   Vaping status: Never Used  Substance and Sexual Activity   Alcohol use: Not Currently    Comment: occasional   Drug use: Never   Sexual activity: Not Currently    Birth control/protection: None  Other Topics Concern   Not on file  Social History Narrative   Not on file   Social Determinants of Health   Financial Resource Strain: Low Risk  (02/24/2023)   Overall Financial Resource Strain (CARDIA)    Difficulty of Paying Living Expenses: Not very hard  Food Insecurity: No Food Insecurity (02/24/2023)   Hunger Vital Sign    Worried About Running Out of Food in the Last Year: Never true    Ran Out of Food in the Last Year: Never true  Transportation Needs: No Transportation Needs (02/24/2023)   PRAPARE - Administrator, Civil Service (Medical): No    Lack of Transportation (Non-Medical): No  Physical Activity: Insufficiently Active (02/24/2023)   Exercise Vital Sign    Days of Exercise per Week: 1 day    Minutes of Exercise per Session: 20 min  Stress: Stress Concern Present (02/24/2023)   [employer] of Occupational Health - Occupational Stress Questionnaire    Feeling of Stress : Rather much  Social Connections: Socially Isolated (02/24/2023)   Social Connection and Isolation Panel [NHANES]    Frequency of Communication with Friends and Family: Once a week    Frequency of Social Gatherings with Friends and Family: Never    Attends Religious Services: 1 to 4 times per year    Active Member of [employer] or Organizations: No    Attends Banker Meetings: Never    Marital Status: Never  married  Intimate Partner Violence: Not At Risk (02/24/2023)   Humiliation, Afraid, Rape, and Kick questionnaire    Fear of Current or Ex-Partner: No    Emotionally Abused: No    Physically Abused: No    Sexually Abused: No   Outpatient Medications Prior to Visit  Medication Sig Dispense Refill   Prenatal Vit-Fe Phos-FA-Omega (VITAFOL GUMMIES) 3.33-0.333-34.8 MG CHEW Chew 3 tablets by mouth daily. 90 tablet 3   NIFEdipine (PROCARDIA-XL/NIFEDICAL-XL) 30 MG 24 hr tablet Take 1 tablet (30 mg total) by mouth daily. 30 tablet 2   sertraline (ZOLOFT) 50 MG  tablet Start taking daily after completing 7 days of 25 mg tablet 30 tablet 5   sertraline (ZOLOFT) 50 MG tablet Take 1 tablet (50 mg total) by mouth daily. (Patient not taking: Reported on 02/24/2023) 90 tablet 1   No facility-administered medications prior to visit.   No Known Allergies  ROS: A complete ROS was performed with pertinent positives/negatives noted in the HPI. The remainder of the ROS are negative.   Objective:   Today's Vitals   02/24/23 0924 02/24/23 1015  BP: (!) 133/93 136/88  Pulse: 79   SpO2: 100%   Weight: 168 lb 1.6 oz (76.2 kg)   Height: 5\' 3"  (1.6 m)     GENERAL: Well-appearing, in NAD. Well nourished.  SKIN: Pink, warm and dry. No rash, lesion, ulceration, or ecchymoses.  Head: Normocephalic. NECK: Trachea midline. Full ROM w/o pain or tenderness. No lymphadenopathy.  EARS: Tympanic membranes are intact, translucent without bulging and without drainage. Appropriate landmarks visualized.  EYES: Conjunctiva clear without exudates. EOMI, PERRL, no drainage present.  NOSE: Septum midline w/o deformity. Nares patent, mucosa pink and non-inflamed w/o drainage. No sinus tenderness.  THROAT: Uvula midline. Oropharynx clear. Tonsils non-inflamed without exudate. Mucous membranes pink and moist.  RESPIRATORY: Chest wall symmetrical. Respirations even and non-labored. Breath sounds clear to auscultation bilaterally.   CARDIAC: S1, S2 present, regular rate and rhythm without murmur or gallops. Peripheral pulses 2+ bilaterally.  MSK: Muscle tone and strength appropriate for age. Joints w/o tenderness, redness, or swelling.  EXTREMITIES: Without clubbing, cyanosis, or edema.  NEUROLOGIC: No motor or sensory deficits. Steady, even gait. C2-C12 intact.  PSYCH/MENTAL STATUS: Alert, oriented x 3. Cooperative, appropriate mood and affect.   EKG: normal sinus rhythm with sinus arrhythmia, normal ECG. No prior EKGs exist for comparison.     Assessment & Plan:   1. Encounter to establish care with new doctor Discussed role of PCP with patient. Will continue management of womens health care with OBGYN. Scheduled to complete pap in December 2024.   2. Postpartum hypertension EKG unremarkable. Will check renal function, CBC and thyroid function with labs today to rule out possible causes of HTN. BP improved with manual BP check in office. Pt and PCP agreeable to decrease salt intake, monitor diet, and start exercise while taking Procardia XL 30mg  daily and monitor BP for the next 4 weeks. If no improvement or worsening in 4 weeks, will increase to Procardia 60mg  XL.   - CBC with Differential/Platelet - Comprehensive metabolic panel - TSH - Hemoglobin A1c - NIFEdipine (PROCARDIA-XL/NIFEDICAL-XL) 30 MG 24 hr tablet; Take 1 tablet (30 mg total) by mouth daily.  Dispense: 30 tablet; Refill: 5  3. Postpartum depression 4. GAD (generalized anxiety disorder) Uncontrolled. Pt tearful in office. Pt will consider starting medication and reach out to PCP. PCP recommended starting Zoloft but patient declined. Recommended counseling resources as well and safety plan reviewed with patient. Will follow up in 4 weeks or sooner if needed.    Patient to reach out to office if new, worrisome, or unresolved symptoms arise or if no improvement in patient's condition. Patient verbalized understanding and is agreeable to treatment plan.  All questions answered to patient's satisfaction.    Return in about 4 weeks (around 03/24/2023) for Virtual Appt Follow up Mood .    Hilbert Bible, [place]

## 2023-02-24 NOTE — Patient Instructions (Signed)
Counseling and Mental Health Resources   [facility]  - For Women and Girls only - Cost based upon sliding scale of income - Financial Aid available  [phone] [address] [place], [place] [address] [facility]  - Mental Health, Substance Abuse Treatment - IV Ketamine, Hydration and Weight Loss Programs - Center for Treatment for Resistant Depression and Suicidal Ideation  [address] [place], [place] [address]  [phone] [email or web address]   [facility]  - Individual and Family Counseling - Assessments and Therapy for Learning Disabilities, ADHD, Autism Spectrum Disorder, Processing Deficits  [phone] [address] [place], [place] [address]  [facility]  [address] [place] [address] [place], [place] [address]  [phone]  [facility]  - Individual, Family, Play and Group Therapy - In person and telehealth sessions available  Phone: [phone] Email: [email or web address]  [place] Location:   [address] [place] [address] [place], [place] [address]   [place] Location:   [address] [place], [place] [address] [facility]  [address] (Inside old [address] [place], [place] [address]  [phone]  [facility], [facility]  Adult, Adolescent and [facility]  [address], [place] [address]  Text:  [phone]   Call:  [phone] Email: [email or web address] Su Ley MA Clinical Psychology  [address] [place] [address]  [phone]  [facility]  - Individual, Group Therapy - Day Programs, Wellness Coaching - Staff Programming, Workshops  [address] [place], [place] [address]  [phone]  [facility] - [place]  Grief and Trauma Counseling    [facility]  - Indivudual Counseling, Enneagram Therapy [address] [place], [place] [address]  [phone] [facility]  - Children, Adolescent, Adult and Family Therapy  [place] Location [phone]  [address] [place], [place] [address]   [place] Location [phone]  [address], [place] [address]    [facility]  Counseling offered by Art therapist Students  - Majority of Patients qualify for financial assistance   [address] [place], [place] [address]  [phone]   [facility]  -Services at "less than a basic fee" sponsored by [facility]  [address] [place], [place] [address]  [phone]

## 2023-02-25 LAB — CBC WITH DIFFERENTIAL/PLATELET
Basophils Absolute: 0 10*3/uL (ref 0.0–0.2)
Basos: 0 %
EOS (ABSOLUTE): 0.2 10*3/uL (ref 0.0–0.4)
Eos: 3 %
Hematocrit: 40.6 % (ref 34.0–46.6)
Hemoglobin: 13.5 g/dL (ref 11.1–15.9)
Immature Grans (Abs): 0 10*3/uL (ref 0.0–0.1)
Immature Granulocytes: 0 %
Lymphocytes Absolute: 3 10*3/uL (ref 0.7–3.1)
Lymphs: 45 %
MCH: 27.8 pg (ref 26.6–33.0)
MCHC: 33.3 g/dL (ref 31.5–35.7)
MCV: 84 fL (ref 79–97)
Monocytes Absolute: 0.6 10*3/uL (ref 0.1–0.9)
Monocytes: 9 %
Neutrophils Absolute: 2.9 10*3/uL (ref 1.4–7.0)
Neutrophils: 43 %
Platelets: 307 10*3/uL (ref 150–450)
RBC: 4.86 x10E6/uL (ref 3.77–5.28)
RDW: 13.7 % (ref 11.7–15.4)
WBC: 6.6 10*3/uL (ref 3.4–10.8)

## 2023-02-25 LAB — COMPREHENSIVE METABOLIC PANEL
ALT: 23 [IU]/L (ref 0–32)
AST: 24 [IU]/L (ref 0–40)
Albumin: 4.5 g/dL (ref 3.9–4.9)
Alkaline Phosphatase: 84 [IU]/L (ref 44–121)
BUN/Creatinine Ratio: 16 (ref 9–23)
BUN: 12 mg/dL (ref 6–20)
Bilirubin Total: 0.2 mg/dL (ref 0.0–1.2)
CO2: 25 mmol/L (ref 20–29)
Calcium: 9.7 mg/dL (ref 8.7–10.2)
Chloride: 103 mmol/L (ref 96–106)
Creatinine, Ser: 0.74 mg/dL (ref 0.57–1.00)
Globulin, Total: 2.8 g/dL (ref 1.5–4.5)
Glucose: 96 mg/dL (ref 70–99)
Potassium: 4.2 mmol/L (ref 3.5–5.2)
Sodium: 142 mmol/L (ref 134–144)
Total Protein: 7.3 g/dL (ref 6.0–8.5)
eGFR: 109 mL/min/{1.73_m2} (ref >59)

## 2023-02-25 LAB — HEMOGLOBIN A1C
Est. average glucose Bld gHb Est-mCnc: 131 mg/dL
Hgb A1c MFr Bld: 6.2 % — ABNORMAL HIGH (ref 4.8–5.6)

## 2023-02-25 LAB — TSH: TSH: 1.04 u[IU]/mL (ref 0.450–4.500)

## 2023-03-02 ENCOUNTER — Encounter (HOSPITAL_BASED_OUTPATIENT_CLINIC_OR_DEPARTMENT_OTHER): Payer: Self-pay | Admitting: Family Medicine

## 2023-03-02 DIAGNOSIS — R7303 Prediabetes: Secondary | ICD-10-CM | POA: Insufficient documentation

## 2023-03-02 NOTE — Progress Notes (Signed)
Hi Sally Crosby,  Your lab work did show that your prediabetes is worsening over the past 2 years.  At this time, I would recommend starting on metformin however this can be passed through the breastmilk and certain percentages.  Do you know at what time you are thinking about weaning from breast-feeding?  Changes to your diet and regular exercise can also help to lower this risk with monitoring sugar and carbohydrate intake.  We can recheck this in 6 months after these changes.  Let me know if you would like to consider the metformin.

## 2023-03-03 ENCOUNTER — Other Ambulatory Visit (HOSPITAL_BASED_OUTPATIENT_CLINIC_OR_DEPARTMENT_OTHER): Payer: Self-pay | Admitting: Family Medicine

## 2023-03-03 MED ORDER — SERTRALINE HCL 25 MG PO TABS: 25.0000 mg | ORAL_TABLET | Freq: Every day | ORAL | 3 refills | Status: DC

## 2023-03-23 NOTE — Progress Notes (Unsigned)
   ANNUAL EXAM Patient name: Sally Crosby MRN [id number]  Date of birth: 01-22-1991 Chief Complaint:   No chief complaint on file.  History of Present Illness:   Sally Crosby is a 32 y.o. G17P3003 female being seen today for a routine annual exam.   Current concerns: ***  Current birth control: Partner vasectomy? ***  No LMP recorded.   Last Pap/Pap History: 09/2019: pap wnl   Review of Systems:   Pertinent items are noted in HPI Denies any headaches, blurred vision, fatigue, shortness of breath, chest pain, abdominal pain, abnormal vaginal discharge/itching/odor/irritation, problems with periods, bowel movements, urination, or intercourse unless otherwise stated above. *** Pertinent History Reviewed:  Reviewed past medical,surgical, social and family history.  Reviewed problem list, medications and allergies. Physical Assessment:  There were no vitals filed for this visit.There is no height or weight on file to calculate BMI.   Physical Examination:  General appearance - well appearing, and in no distress Mental status - alert, oriented to person, place, and time Psych:  She has a normal mood and affect Skin - warm and dry, normal color, no suspicious lesions noted Chest - effort normal Heart - normal rate  Breasts - breasts appear normal, no suspicious masses, no skin or nipple changes or axillary nodes Abdomen - soft, nontender, nondistended, no masses or organomegaly Pelvic -  VULVA: normal appearing vulva with no masses, tenderness or lesions  VAGINA: normal appearing vagina with normal color and discharge, no lesions  CERVIX: normal appearing cervix without discharge or lesions, no CMT UTERUS: uterus is felt to be normal size, shape, consistency and nontender  ADNEXA: No adnexal masses or tenderness noted. Extremities:  No swelling or varicosities noted  Chaperone present for exam  No results found for this or any previous visit (from the past 24 hours).   Assessment & Plan:  Diagnoses and all orders for this visit:  Encounter for annual routine gynecological examination  - Cervical cancer screening: Discussed guidelines. Pap with HPV done - Gardasil: {Blank single:19197::"***","has not yet had. Will provide information","completed","has not yet had. Counseling provided and she declines","Has not yet had. Counseling provided and pt accepts"} - GC/CT: {Blank single:19197::"accepts","declines","not indicated"} - Birth Control: {Birth control type:23956} - Breast Health: Encouraged self breast awareness/SBE. Teaching provided.  - F/U 12 months and prn     No orders of the defined types were placed in this encounter.   Meds: No orders of the defined types were placed in this encounter.   Follow-up: No follow-ups on file.  Milas Hock, MD 03/23/2023 12:54 PM

## 2023-03-26 ENCOUNTER — Other Ambulatory Visit (HOSPITAL_COMMUNITY)
Admission: RE | Admit: 2023-03-26 | Discharge: 2023-03-26 | Disposition: A | Discharge status: Home / Self Care | Payer: Medicaid Other | Source: Ambulatory Visit | Attending: Obstetrics and Gynecology | Admitting: Obstetrics and Gynecology

## 2023-03-26 ENCOUNTER — Ambulatory Visit (INDEPENDENT_AMBULATORY_CARE_PROVIDER_SITE_OTHER): Payer: Medicaid Other | Admitting: Obstetrics and Gynecology

## 2023-03-26 ENCOUNTER — Encounter: Payer: Self-pay | Admitting: Obstetrics and Gynecology

## 2023-03-26 ENCOUNTER — Other Ambulatory Visit: Payer: Self-pay

## 2023-03-26 VITALS — BP 141/86 | HR 86 | Ht 63.0 in | Wt 165.0 lb

## 2023-03-26 DIAGNOSIS — Z1151 Encounter for screening for human papillomavirus (HPV): Secondary | ICD-10-CM | POA: Diagnosis not present

## 2023-03-26 DIAGNOSIS — Z01419 Encounter for gynecological examination (general) (routine) without abnormal findings: Secondary | ICD-10-CM

## 2023-03-27 ENCOUNTER — Other Ambulatory Visit (HOSPITAL_BASED_OUTPATIENT_CLINIC_OR_DEPARTMENT_OTHER): Payer: Self-pay | Admitting: Family Medicine

## 2023-03-27 LAB — CYTOLOGY - PAP
Comment: NEGATIVE
Diagnosis: NEGATIVE
High risk HPV: NEGATIVE

## 2023-03-28 ENCOUNTER — Other Ambulatory Visit: Payer: Self-pay | Admitting: Obstetrics and Gynecology

## 2023-03-28 DIAGNOSIS — Z3480 Encounter for supervision of other normal pregnancy, unspecified trimester: Secondary | ICD-10-CM

## 2023-03-30 ENCOUNTER — Other Ambulatory Visit: Payer: Self-pay

## 2023-03-30 ENCOUNTER — Encounter: Payer: Self-pay | Admitting: Obstetrics and Gynecology

## 2023-04-02 ENCOUNTER — Encounter (HOSPITAL_BASED_OUTPATIENT_CLINIC_OR_DEPARTMENT_OTHER): Payer: Self-pay | Admitting: Family Medicine

## 2023-04-07 ENCOUNTER — Other Ambulatory Visit: Payer: Self-pay | Admitting: Obstetrics and Gynecology

## 2023-04-07 MED ORDER — VITAFOL GUMMIES 3.33-0.333-34.8 MG PO CHEW: 3.0000 | CHEWABLE_TABLET | Freq: Every day | ORAL | 11 refills | Status: DC

## 2023-04-07 NOTE — Progress Notes (Signed)
 Sent in Rx for Vitafol gummies per pt request.

## 2023-04-14 ENCOUNTER — Other Ambulatory Visit (HOSPITAL_BASED_OUTPATIENT_CLINIC_OR_DEPARTMENT_OTHER): Payer: Self-pay | Admitting: Family Medicine

## 2023-04-14 MED ORDER — SERTRALINE HCL 50 MG PO TABS: 50.0000 mg | ORAL_TABLET | Freq: Every day | ORAL | 3 refills | Status: DC

## 2023-08-27 ENCOUNTER — Telehealth (HOSPITAL_BASED_OUTPATIENT_CLINIC_OR_DEPARTMENT_OTHER): Payer: Self-pay | Admitting: *Deleted

## 2023-08-27 NOTE — Telephone Encounter (Signed)
-----   Message from Ar[place]a Kocher Caudle sent at 08/24/2023  5:57 PM EDT ----- Please see if patient is able to come for Lab (A1C only) and BP check in office within the next month. If so, I'll put in lab order ----- Message ----- From: Nonda Bays, FNP Sent: 08/24/2023  12:00 AM EDT To: Nonda Bays, FNP  BP Check and A1C recheck needed. Pt trying diet control for A1C

## 2023-08-27 NOTE — Telephone Encounter (Signed)
 Attempted to call patient to see if I could get her scheduled for a lab visit and to have her BP checked at that visit but unable to reach and unable to leave VM as mailbox is not set up. Will send a mychart message of this info to have pt either call the office or let me know if I can schedule her a visit.

## 2023-09-01 ENCOUNTER — Encounter (HOSPITAL_BASED_OUTPATIENT_CLINIC_OR_DEPARTMENT_OTHER): Payer: Self-pay | Admitting: *Deleted

## 2023-09-01 NOTE — Telephone Encounter (Signed)
 Attempted to call pt to see if I could schedule a follow up office visit with Sally Crosby as it looks like she was never seen for a visit 4 weeks after 01/2023 visit.   Unable to reach pt. Due to multiple attempts trying to reach pt without being able to do so, will send letter to pt to have her contact the office.

## 2023-09-28 ENCOUNTER — Ambulatory Visit (HOSPITAL_BASED_OUTPATIENT_CLINIC_OR_DEPARTMENT_OTHER): Admitting: Family Medicine

## 2023-09-28 ENCOUNTER — Encounter (HOSPITAL_BASED_OUTPATIENT_CLINIC_OR_DEPARTMENT_OTHER): Payer: Self-pay | Admitting: Family Medicine

## 2023-09-28 VITALS — BP 124/74 | HR 59 | Resp 16 | Ht 63.0 in | Wt 169.3 lb

## 2023-09-28 DIAGNOSIS — R7303 Prediabetes: Secondary | ICD-10-CM

## 2023-09-28 DIAGNOSIS — F53 Postpartum depression: Secondary | ICD-10-CM | POA: Diagnosis not present

## 2023-09-28 DIAGNOSIS — O165 Unspecified maternal hypertension, complicating the puerperium: Secondary | ICD-10-CM

## 2023-09-28 DIAGNOSIS — Z3A00 Weeks of gestation of pregnancy not specified: Secondary | ICD-10-CM | POA: Diagnosis not present

## 2023-09-28 LAB — POCT GLYCOSYLATED HEMOGLOBIN (HGB A1C)
HbA1c POC (<> result, manual entry): 5.6 % (ref 4.0–5.6)
HbA1c, POC (controlled diabetic range): 5.6 % (ref 0.0–7.0)
HbA1c, POC (prediabetic range): 5.6 % — AB (ref 5.7–6.4)
Hemoglobin A1C: 5.6 % (ref 4.0–5.6)

## 2023-09-28 NOTE — Patient Instructions (Signed)
 GLP 1 medications:  Zepbound, [id number]   Weight Loss Medication:  Phentermine

## 2023-09-28 NOTE — Progress Notes (Signed)
 Subjective:   Sally Crosby Jan 06, 1991 09/28/2023  Chief Complaint  Patient presents with   Follow-up    Wants refill of Prenatal Gummies Still Breastfeeding Not happy with weight as she is working out and not losing.  Mood way better on Zoloft !!!!!    HPI: Sally Crosby presents today for re-assessment and management of chronic medical conditions.  HYPERTENSION: Sally Crosby presents for the medical management of hypertension.  Patient's current hypertension medication regimen is: Nifedipine  30 mg XL Patient is  currently taking prescribed medications for HTN.  Patient is  regularly keeping a check on BP at home.  Adhering to low sodium diet: yes Exercising Regularly: yes Denies headache, dizziness, CP, SHOB, vision changes.   BP Readings from Last 3 Encounters:  09/28/23 124/74  03/26/23 (!) 141/86  02/24/23 136/88    IMPAIRED FASTING GLUCOSE Sally Crosby is here for medical management of impaired fasting glucose.  Patient's current IFG medication regimen is: Diet, Exercise  Adhering to a diabetic diet: Yes Exercising Regularly: She works out daily 30 minutes, using walking pad.  Denies polydipsia, polyphagia, polyuria.   Wt Readings from Last 3 Encounters:  09/28/23 169 lb 4.8 oz (76.8 kg)  03/26/23 165 lb (74.8 kg)  02/24/23 168 lb 1.6 oz (76.2 kg)    Lab Results  Component Value Date   HGBA1C 5.6 09/28/2023   HGBA1C 5.6 09/28/2023   HGBA1C 5.6 (A) 09/28/2023   HGBA1C 5.6 09/28/2023    DEPRESSION: Sally Crosby presents for the medical management of depression.  Current medication regimen: Zoloft  50mg   Counseling: Recommended Well controlled: Yes, currently per patient. She is doing well with postpartum depression. Denies SI/HI.     09/28/2023   10:24 AM 09/28/2023   10:23 AM 03/26/2023    9:57 AM 02/24/2023    9:32 AM 01/01/2023    9:52 AM  Depression screen PHQ 2/9  Decreased Interest 0 0 0 1 0  Down, Depressed, Hopeless 0 0 0 0 0   PHQ - 2 Score 0 0 0 1 0  Altered sleeping 0  0 0 1  Tired, decreased energy 0  1 3 3   Change in appetite 0  1 1 0  Feeling bad or failure about yourself  0  0 1 0  Trouble concentrating 0  0 1 1  Moving slowly or fidgety/restless 0  0 0 0  Suicidal thoughts 0  0 0 0  PHQ-9 Score 0  2 7 5   Difficult doing work/chores Not difficult at all   Somewhat difficult       09/28/2023   10:23 AM 03/26/2023    9:57 AM 02/24/2023    9:33 AM 01/01/2023    9:54 AM  GAD 7 : Generalized Anxiety Score  Nervous, Anxious, on Edge 0 1 2 1   Control/stop worrying 0 0 1 1  Worry too much - different things 0 1 1 1   Trouble relaxing 0 0 1 1  Restless 0 0 0 1  Easily annoyed or irritable 0 1 2 1   Afraid - awful might happen 0 1 2 1   Total GAD 7 Score 0 4 9 7   Anxiety Difficulty Not difficult at all  Not difficult at all        The following portions of the patient's history were reviewed and updated as appropriate: past medical history, past surgical history, family history, social history, allergies, medications, and problem list.   Patient Active Problem List   Diagnosis Date Noted  Prediabetes 03/02/2023   GAD (generalized anxiety disorder) 02/24/2023   Postpartum depression 01/01/2023   Postpartum hypertension 11/27/2022   ADD (attention deficit disorder) without hyperactivity 06/02/2022   Anxiety 07/20/2020   History of gestational hypertension 07/20/2020   Hx Type 3a perineal laceration 07/09/2019   H/O eczema 07/06/2019   History of herpes genitalis 01/06/2019   Past Medical History:  Diagnosis Date   Alpha thalassemia silent carrier 02/17/2019   Partner is negative     Anxiety    Fibroids 02/17/2019   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 adolescence 07/20/2020   Past Surgical History:  Procedure Laterality Date   HERNIA REPAIR  1999   Family History  Problem Relation Age of Onset   Hypertension Mother     Outpatient Medications Prior to Visit  Medication Sig Dispense Refill   NIFEdipine  (PROCARDIA -XL/NIFEDICAL-XL) 30 MG 24 hr tablet Take 1 tablet (30 mg total) by mouth daily. 30 tablet 5   Prenatal Vit-Fe Phos-FA-Omega (VITAFOL  GUMMIES) 3.33-0.333-34.8 MG CHEW Chew 3 tablets by mouth daily. 90 tablet 11   sertraline  (ZOLOFT ) 50 MG tablet Take 1 tablet (50 mg total) by mouth daily. 90 tablet 3   Prenatal Vit-Fe Phos-FA-Omega (VITAFOL  GUMMIES) 3.33-0.333-34.8 MG CHEW Chew 3 tablets by mouth daily. (Patient not taking: Reported on 09/28/2023) 90 tablet 3   No facility-administered medications prior to visit.   No Known Allergies   ROS: A complete ROS was performed with pertinent positives/negatives noted in the HPI. The remainder of the ROS are negative.    Objective:   Today's Vitals   09/28/23 1020  BP: 124/74  Pulse: (!) 59  Resp: 16  SpO2: 99%  Weight: 169 lb 4.8 oz (76.8 kg)  Height: 5' 3 (1.6 m)  PainSc: 0-No pain    Physical Exam          GENERAL: Well-appearing, in NAD. Well nourished.  SKIN: Pink, warm and dry. Head: Normocephalic. NECK: Trachea midline. Full ROM w/o pain or tenderness. RESPIRATORY: Chest wall symmetrical. Respirations even and non-labored.  EXTREMITIES: Without clubbing, cyanosis, or edema.  NEUROLOGIC: No motor or sensory deficits. Steady, even gait. C2-C12 intact.  PSYCH/MENTAL STATUS: Alert, oriented x 3. Cooperative, appropriate mood and affect.   Results for orders placed or performed in visit on 09/28/23  POCT HgB A1C  Result Value Ref Range   Hemoglobin A1C 5.6 4.0 - 5.6 %   HbA1c POC (<> result, manual entry) 5.6 4.0 - 5.6 %   HbA1c, POC (prediabetic range) 5.6 (A) 5.7 - 6.4 %   HbA1c, POC (controlled diabetic range) 5.6 0.0 - 7.0 %        Assessment & Plan:  1. Prediabetes (Primary) A1C significantly improved to 5.6 and out of predaibetic range. Pt congratulated on progress and recommend dietary changes. We discussed weight in  depth and recommend she start caloric deficit and increase protein when she stops breastfeeding. If considering medications, she will reach out to PCP after weaning. Pt agreeable.  - POCT HgB A1C  2. Postpartum hypertension Controlled. Continue current regimen. Discussed monitoring BP with weight loss as medication may need to be titrated.   3. Postpartum depression Controlled. Continue current regimen. Safety plan reviewed.    No orders of the defined types were placed in this encounter.  Lab Orders         POCT HgB A1C      Return in about 6 months (around 03/29/2024) for ANNUAL PHYSICAL, Prediabetes  check (fasting labs prior).    Patient to reach out to office if new, worrisome, or unresolved symptoms arise or if no improvement in patient's condition. Patient verbalized understanding and is agreeable to treatment plan. All questions answered to patient's satisfaction.    Thersia Schuyler Stark, [place]

## 2023-10-16 ENCOUNTER — Encounter (INDEPENDENT_AMBULATORY_CARE_PROVIDER_SITE_OTHER): Payer: Self-pay | Admitting: Family Medicine

## 2023-10-16 DIAGNOSIS — I10 Essential (primary) hypertension: Secondary | ICD-10-CM

## 2023-10-16 DIAGNOSIS — E663 Overweight: Secondary | ICD-10-CM

## 2023-10-19 NOTE — Telephone Encounter (Signed)
 Please see mychart message sent by pt and advise.

## 2023-11-03 MED ORDER — PHENTERMINE HCL 15 MG PO CAPS: 15.0000 mg | ORAL_CAPSULE | ORAL | 1 refills | Status: DC

## 2023-11-03 NOTE — Telephone Encounter (Signed)
 Patient is doing well without BP medication with BP well documented and well controlled. She is requesting to try Phentermine  for weight loss post pregnancy. Will start with low dose and she will monitor BP.

## 2024-01-10 ENCOUNTER — Encounter (HOSPITAL_BASED_OUTPATIENT_CLINIC_OR_DEPARTMENT_OTHER): Payer: Self-pay | Admitting: Family Medicine

## 2024-01-11 NOTE — Telephone Encounter (Signed)
 Please see mychart message sent by pt and advise.

## 2024-01-12 ENCOUNTER — Other Ambulatory Visit (HOSPITAL_BASED_OUTPATIENT_CLINIC_OR_DEPARTMENT_OTHER): Payer: Self-pay | Admitting: Family Medicine

## 2024-01-12 MED ORDER — SERTRALINE HCL 50 MG PO TABS: 75.0000 mg | ORAL_TABLET | Freq: Every day | ORAL | 2 refills | Status: DC

## 2024-02-12 ENCOUNTER — Telehealth (INDEPENDENT_AMBULATORY_CARE_PROVIDER_SITE_OTHER): Admitting: Family Medicine

## 2024-02-12 VITALS — Ht 63.0 in | Wt 142.0 lb

## 2024-02-12 DIAGNOSIS — Z6825 Body mass index (BMI) 25.0-25.9, adult: Secondary | ICD-10-CM

## 2024-02-12 DIAGNOSIS — E663 Overweight: Secondary | ICD-10-CM | POA: Diagnosis not present

## 2024-02-12 DIAGNOSIS — F411 Generalized anxiety disorder: Secondary | ICD-10-CM | POA: Diagnosis not present

## 2024-02-12 DIAGNOSIS — F53 Postpartum depression: Secondary | ICD-10-CM

## 2024-02-12 DIAGNOSIS — F988 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 adolescence: Secondary | ICD-10-CM

## 2024-02-12 NOTE — Progress Notes (Signed)
 Virtual Video Visit  I connected with Sally Crosby on 02/12/24 at  9:10 AM EST by a video enabled telemedicine application and verified that I am speaking with the correct person using two identifiers.   Location patient: Home Location provider: Clinic Persons participating in the virtual visit: Patient; Damien Many CMA; Thersia Stark, FNP-C  I discussed the limitations of evaluation and management by telemedicine and the availability of in-person appointments. The patient expressed understanding and agreed to proceed.  Chief Complaint  Patient presents with   Anxiety    4-6 week follow up on anxiety. Pt states things have been going good and denies any concerns for today.    SUBJECTIVE:  HPI:   ANXIETY AND DEPRESSION:  Sally Crosby patient states she is doing well on her increased Sertraline  75mg . She has noticed significant improvement with her mental clarity, feeling down, crying less. She is doing well with parenting her children and denies ongoing concerns of anxiety and stress.      02/12/2024    9:05 AM 09/28/2023   10:23 AM 03/26/2023    9:57 AM 02/24/2023    9:33 AM  GAD 7 : Generalized Anxiety Score  Nervous, Anxious, on Edge 0 0 1 2  Control/stop worrying 1 0 0 1  Worry too much - different things 0 0 1 1  Trouble relaxing 0 0 0 1  Restless 1 0 0 0  Easily annoyed or irritable 0 0 1 2  Afraid - awful might happen 0 0 1 2  Total GAD 7 Score 2 0 4 9  Anxiety Difficulty Not difficult at all Not difficult at all  Not difficult at all       02/12/2024    9:04 AM 09/28/2023   10:24 AM 09/28/2023   10:23 AM 03/26/2023    9:57 AM 02/24/2023    9:32 AM  Depression screen PHQ 2/9  Decreased Interest 0 0 0 0 1  Down, Depressed, Hopeless 0 0 0 0 0  PHQ - 2 Score 0 0 0 0 1  Altered sleeping 0 0  0 0  Tired, decreased energy 1 0  1 3  Change in appetite 0 0  1 1  Feeling bad or failure about yourself  0 0  0 1  Trouble concentrating 2 0  0 1  Moving slowly  or fidgety/restless 0 0  0 0  Suicidal thoughts 0 0  0 0  PHQ-9 Score 3 0   2  7   Difficult doing work/chores Not difficult at all Not difficult at all   Somewhat difficult     Data saved with a previous flowsheet row definition      WEIGHT LOSS:  She did use Phentermine  for the last 2-3 months and had significant improvement in weight in postpartum period. While taking, she noticed she was able to focus and had better control of ADHD. She was previously on stimulant medication and is desiring to restart medication for better control of ADHD.   Wt Readings from Last 3 Encounters:  02/12/24 142 lb (64.4 kg)  09/28/23 169 lb 4.8 oz (76.8 kg)  03/26/23 165 lb (74.8 kg)     The following portions of the patient's history were reviewed and updated as appropriate: medical history, surgical history, medications, allergies, social history, and family history.    Past Medical History:  Diagnosis Date   Alpha thalassemia silent carrier 02/17/2019   Partner is negative     Anxiety  Depression    Fibroids 02/17/2019   Hypertension    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 adolescence 07/20/2020   Past Surgical History:  Procedure Laterality Date   HERNIA REPAIR  1999     Current Outpatient Medications:    sertraline  (ZOLOFT ) 50 MG tablet, Take 1.5 tablets (75 mg total) by mouth daily., Disp: 90 tablet, Rfl: 2   NIFEdipine  (PROCARDIA -XL/NIFEDICAL-XL) 30 MG 24 hr tablet, Take 1 tablet (30 mg total) by mouth daily., Disp: 30 tablet, Rfl: 5 No Known Allergies  Social History   Socioeconomic History   Marital status: Single    Spouse name: Not on file   Number of children: Not on file   Years of education: Not on file   Highest education level: Bachelor's degree (e.g., BA, AB, BS)  Occupational History   Not on file  Tobacco Use   Smoking status: Never    Passive exposure: Never   Smokeless tobacco: Never  Vaping  Use   Vaping status: Never Used  Substance and Sexual Activity   Alcohol use: Yes    Comment: 1/2 glasses daily   Drug use: Never   Sexual activity: Not Currently    Birth control/protection: None  Other Topics Concern   Not on file  Social History Narrative   Not on file   Social Drivers of Health   Financial Resource Strain: Medium Risk (02/12/2024)   Overall Financial Resource Strain (CARDIA)    Difficulty of Paying Living Expenses: Somewhat hard  Food Insecurity: No Food Insecurity (02/12/2024)   Hunger Vital Sign    Worried About Running Out of Food in the Last Year: Never true    Ran Out of Food in the Last Year: Never true  Transportation Needs: No Transportation Needs (02/12/2024)   PRAPARE - Administrator, Civil Service (Medical): No    Lack of Transportation (Non-Medical): No  Physical Activity: Insufficiently Active (02/12/2024)   Exercise Vital Sign    Days of Exercise per Week: 4 days    Minutes of Exercise per Session: 30 min  Stress: No Stress Concern Present (02/12/2024)   [employer] of Occupational Health - Occupational Stress Questionnaire    Feeling of Stress: Only a little  Social Connections: Moderately Integrated (02/12/2024)   Social Connection and Isolation Panel    Frequency of Communication with Friends and Family: Three times a week    Frequency of Social Gatherings with Friends and Family: Once a week    Attends Religious Services: More than 4 times per year    Active Member of [employer] or Organizations: Yes    Attends Banker Meetings: 1 to 4 times per year    Marital Status: Never married  Intimate Partner Violence: Not At Risk (02/24/2023)   Humiliation, Afraid, Rape, and Kick questionnaire    Fear of Current or Ex-Partner: No    Emotionally Abused: No    Physically Abused: No    Sexually Abused: No    Family History  Problem Relation Age of Onset   Hypertension Mother    ADD / ADHD Sister    Anxiety  disorder Brother      ROS: A complete ROS was performed with pertinent positives/negatives noted in the HPI. The remainder of the ROS are negative.    OBJECTIVE:  VITALS per patient if applicable: Today's Vitals   02/12/24 0906  Weight: 142 lb (64.4 kg)  Height: 5' 3 (1.6 m)  Body mass index is 25.15 kg/m.   GENERAL: Alert and oriented. Appears well and in no acute distress. HEENT: Atraumatic. Conjunctiva clear. No obvious abnormalities on inspection of external nose and ears. NECK: Normal movements of the head and neck. LUNGS: On inspection, no signs of respiratory distress. Breathing rate appears normal. No obvious gross SOB, gasping or wheezing, and no conversational dyspnea. CV: No obvious cyanosis. MS: Moves all visible extremities without noticeable abnormality. PSYCH/NEURO: Pleasant and cooperative. No obvious depression or anxiety. Speech and thought processing grossly intact.  ASSESSMENT AND PLAN: 1. Postpartum depression (Primary) 2. GAD (generalized anxiety disorder) Controlled. Continue current regimen of Sertraline  75mg  daily. Safety plan reviewed.   3. ADD (attention deficit disorder) without hyperactivity Will schedule OV to discuss medication options and complete CSA, UDS in office.   4. Overweight [E66.3] Patient had significant weight loss with calorie deficit, exercise and phentermine . Will continue with lifestyle changes.     I discussed the assessment and treatment plan with the patient. The patient was provided an opportunity to ask questions and all were answered. The patient agreed with the plan and demonstrated an understanding of the instructions.   The patient was advised to call back or seek an in-person evaluation if the symptoms worsen or if the condition fails to improve as anticipated.  I provided 8 minutes of non-face-to-face time during this encounter.  Return if symptoms worsen or fail to improve.  Thersia Schuyler Stark, [place]

## 2024-02-23 ENCOUNTER — Ambulatory Visit (INDEPENDENT_AMBULATORY_CARE_PROVIDER_SITE_OTHER): Admitting: Family Medicine

## 2024-02-23 ENCOUNTER — Encounter (HOSPITAL_BASED_OUTPATIENT_CLINIC_OR_DEPARTMENT_OTHER): Payer: Self-pay | Admitting: Family Medicine

## 2024-02-23 VITALS — BP 130/78 | HR 86 | Ht 63.0 in | Wt 141.7 lb

## 2024-02-23 DIAGNOSIS — F988 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 adolescence: Secondary | ICD-10-CM

## 2024-02-23 LAB — POCT URINE DRUG SCREEN
Morphine: NOT DETECTED
POC BENZODIAZEPINES UR: NOT DETECTED
POC Cocaine UR: NOT DETECTED
POC Marijuana UR: NOT DETECTED
POC Oxycodone UR: NOT DETECTED

## 2024-02-23 MED ORDER — AMPHETAMINE-DEXTROAMPHETAMINE 5 MG PO TABS: ORAL_TABLET | ORAL | 0 refills | Status: DC

## 2024-02-23 NOTE — Progress Notes (Signed)
 Subjective:   Sally Crosby March 28, 1991 02/23/2024  Chief Complaint  Patient presents with   medication discussion    Pt is here today to discuss being restarted on medication for ADD.     ADD/ADHD EVALUATION Sally Crosby presents for the evaluation of ADD/ADHD. Patient was previously  on generic Adderall 20mg  daily approximately. She has been off for several years due to pregnancy.   Work/school performance:  Poor per patient with work and focusing  Difficulty sustaining attention/completing tasks: yes Distracted by extraneous stimuli: yes Does not listen when spoken to: no  Fidgets with hands or feet: no Unable to stay in seat: no Blurts out/interrupts others: no  Previous psychiatry evaluation: yes Previous medications: Adderall      The following portions of the patient's history were reviewed and updated as appropriate: past medical history, past surgical history, family history, social history, allergies, medications, and problem list.   Patient Active Problem List   Diagnosis Date Noted   Prediabetes 03/02/2023   GAD (generalized anxiety disorder) 02/24/2023   Postpartum depression 01/01/2023   Postpartum hypertension 11/27/2022   ADD (attention deficit disorder) without hyperactivity 06/02/2022   Anxiety 07/20/2020   History of gestational hypertension 07/20/2020   Hx Type 3a perineal laceration 07/09/2019   H/O eczema 07/06/2019   History of herpes genitalis 01/06/2019   Past Medical History:  Diagnosis Date   Alpha thalassemia silent carrier 02/17/2019   Partner is negative     Anxiety    Depression    Fibroids 02/17/2019   Hypertension    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 adolescence 07/20/2020   Past Surgical History:  Procedure Laterality Date   HERNIA REPAIR  1999   Family History  Problem Relation Age of Onset   Hypertension Mother    ADD / ADHD Sister    Anxiety disorder  Brother    Outpatient Medications Prior to Visit  Medication Sig Dispense Refill   sertraline  (ZOLOFT ) 50 MG tablet Take 1.5 tablets (75 mg total) by mouth daily. 90 tablet 2   NIFEdipine  (PROCARDIA -XL/NIFEDICAL-XL) 30 MG 24 hr tablet Take 1 tablet (30 mg total) by mouth daily. 30 tablet 5   No facility-administered medications prior to visit.   No Known Allergies   ROS: A complete ROS was performed with pertinent positives/negatives noted in the HPI. The remainder of the ROS are negative.    Objective:   Today's Vitals   02/23/24 0813 02/23/24 0829  BP: (!) 150/81 130/78  Pulse: 86   SpO2: 99%   Weight: 141 lb 11.2 oz (64.3 kg)   Height: 5' 3 (1.6 m)     Physical Exam   GENERAL: Well-appearing, in NAD. Well nourished.  SKIN: Pink, warm and dry.  Head: Normocephalic. NECK: Trachea midline. Full ROM w/o pain or tenderness.  RESPIRATORY: Chest wall symmetrical. Respirations even and non-labored.  CARDIAC: S1, S2 present, regular rate and rhythm without murmur or gallops. Peripheral pulses 2+ bilaterally.  MSK: Muscle tone and strength appropriate for age.  NEUROLOGIC: No motor or sensory deficits. Steady, even gait. C2-C12 intact.  PSYCH/MENTAL STATUS: Alert, oriented x 3. Cooperative, appropriate mood and affect.    Results for orders placed or performed in visit on 02/23/24  POCT Urine Drug Screen  Result Value Ref Range   POC Methamphetamine UR     POC Opiate Ur     POC Barbiturate UR     POC Amphetamine  UR  POC Oxycodone  UR None Detected None Detected   POC Cocaine UR None Detected None Detected   POC Ecstasy UR     POC TRICYCLICS UR     POC PHENCYCLIDINE UR     POC Marijuana UR None Detected None Detected   POC Methadone UR     POC BENZODIAZEPINES UR None Detected None Detected   URINE TEMPERATURE     POC DRUG SCREEN OXIDANTS URINE     POC SPECIFIC GRAVITY URINE     POC PH URINE     Methylenedioxyamphetamine       Morphine none detected     The  ASCVD Risk score (Arnett DK, et al., 2019) failed to calculate for the following reasons:   The 2019 ASCVD risk score is only valid for ages 68 to 33     Assessment & Plan:   1. ADD (attention deficit disorder) without hyperactivity (Primary) Discussed use of Adderall and safe use with patient. UDS and CSA updated. Patient previously tolerated well. Will start Adderall 5mg  daily and can increase to 10mg  after 1-2 weeks if needed. Will follow up in 3 months or sooner if needed for titration.   - POCT Urine Drug Screen - amphetamine -dextroamphetamine  (ADDERALL) 5 MG tablet; Take 1 tablet (5 mg total) by mouth daily for 10 days, THEN 2 tablets (10 mg total) daily for 20 days.  Dispense: 30 tablet; Refill: 0     Meds ordered this encounter  Medications   amphetamine -dextroamphetamine  (ADDERALL) 5 MG tablet    Sig: Take 1 tablet (5 mg total) by mouth daily for 10 days, THEN 2 tablets (10 mg total) daily for 20 days.    Dispense:  30 tablet    Refill:  0    Supervising Provider:   DE CUBA, RAYMOND J [[id number]]   Lab Orders         POCT Urine Drug Screen     Return in about 3 months (around 05/25/2024) for Follow up ADD (Virtual) .    Patient to reach out to office if new, worrisome, or unresolved symptoms arise or if no improvement in patient's condition. Patient verbalized understanding and is agreeable to treatment plan. All questions answered to patient's satisfaction.    Sally Crosby, [place]

## 2024-03-09 ENCOUNTER — Encounter (HOSPITAL_BASED_OUTPATIENT_CLINIC_OR_DEPARTMENT_OTHER): Payer: Self-pay | Admitting: Family Medicine

## 2024-03-09 NOTE — Telephone Encounter (Signed)
 Please see mychart message sent by pt and advise.

## 2024-03-10 ENCOUNTER — Other Ambulatory Visit (HOSPITAL_BASED_OUTPATIENT_CLINIC_OR_DEPARTMENT_OTHER): Payer: Self-pay | Admitting: Family Medicine

## 2024-03-10 DIAGNOSIS — F988 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 adolescence: Secondary | ICD-10-CM

## 2024-03-10 MED ORDER — AMPHETAMINE-DEXTROAMPHETAMINE 5 MG PO TABS: 5.0000 mg | ORAL_TABLET | Freq: Two times a day (BID) | ORAL | 0 refills | Status: DC

## 2024-03-28 ENCOUNTER — Other Ambulatory Visit (HOSPITAL_COMMUNITY): Payer: Self-pay

## 2024-04-06 ENCOUNTER — Other Ambulatory Visit (HOSPITAL_BASED_OUTPATIENT_CLINIC_OR_DEPARTMENT_OTHER): Payer: Self-pay | Admitting: Family Medicine

## 2024-04-15 ENCOUNTER — Encounter (HOSPITAL_BASED_OUTPATIENT_CLINIC_OR_DEPARTMENT_OTHER): Payer: Self-pay | Admitting: Family Medicine

## 2024-04-15 ENCOUNTER — Other Ambulatory Visit (HOSPITAL_BASED_OUTPATIENT_CLINIC_OR_DEPARTMENT_OTHER): Payer: Self-pay | Admitting: Family Medicine

## 2024-04-18 NOTE — Telephone Encounter (Signed)
 Please see mychart message sent by pt and advise.

## 2024-04-21 ENCOUNTER — Other Ambulatory Visit (HOSPITAL_BASED_OUTPATIENT_CLINIC_OR_DEPARTMENT_OTHER): Payer: Self-pay | Admitting: Family Medicine

## 2024-04-21 DIAGNOSIS — F988 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 adolescence: Secondary | ICD-10-CM

## 2024-04-21 MED ORDER — AMPHETAMINE-DEXTROAMPHETAMINE 5 MG PO TABS: 5.0000 mg | ORAL_TABLET | Freq: Two times a day (BID) | ORAL | 0 refills | Status: AC

## 2024-05-25 ENCOUNTER — Ambulatory Visit (HOSPITAL_BASED_OUTPATIENT_CLINIC_OR_DEPARTMENT_OTHER): Admitting: Family Medicine
